# Patient Record
Sex: Male | Born: 1985 | Race: Black or African American | Hispanic: No | Marital: Single | Smoking: Never smoker
Health system: Southern US, Community
[De-identification: ages and names within clinical notes are randomized; demographics above are authoritative.]

## PROBLEM LIST (undated history)

## (undated) DIAGNOSIS — T7840XA Allergy, unspecified, initial encounter: Secondary | ICD-10-CM

## (undated) DIAGNOSIS — G809 Cerebral palsy, unspecified: Secondary | ICD-10-CM

## (undated) HISTORY — DX: Cerebral palsy, unspecified: G80.9

## (undated) HISTORY — DX: Allergy, unspecified, initial encounter: T78.40XA

## (undated) HISTORY — PX: TYMPANOSTOMY TUBE PLACEMENT: SHX32

## (undated) HISTORY — PX: ADENOIDECTOMY: SUR15

---

## 2005-03-04 ENCOUNTER — Emergency Department: Payer: Self-pay | Admitting: General Practice

## 2005-12-09 ENCOUNTER — Emergency Department: Payer: Self-pay | Admitting: Emergency Medicine

## 2007-11-05 ENCOUNTER — Encounter: Payer: Self-pay | Admitting: Family Medicine

## 2007-12-01 ENCOUNTER — Encounter: Payer: Self-pay | Admitting: Family Medicine

## 2007-12-31 ENCOUNTER — Encounter: Payer: Self-pay | Admitting: Family Medicine

## 2010-11-20 ENCOUNTER — Encounter: Payer: Self-pay | Admitting: Family Medicine

## 2010-12-01 ENCOUNTER — Encounter: Payer: Self-pay | Admitting: Family Medicine

## 2011-07-30 HISTORY — PX: DENTAL SURGERY: SHX609

## 2012-03-17 ENCOUNTER — Encounter: Payer: Self-pay | Admitting: Family Medicine

## 2012-04-01 ENCOUNTER — Encounter: Payer: Self-pay | Admitting: Family Medicine

## 2013-01-17 ENCOUNTER — Emergency Department: Payer: Self-pay | Admitting: Emergency Medicine

## 2013-06-15 ENCOUNTER — Encounter: Payer: Self-pay | Admitting: Family Medicine

## 2014-06-08 ENCOUNTER — Emergency Department: Payer: Self-pay | Admitting: Emergency Medicine

## 2014-10-05 LAB — LIPID PANEL
CHOLESTEROL: 194 mg/dL (ref 0–200)
HDL: 68 mg/dL (ref 35–70)
LDL Cholesterol: 110 mg/dL
TRIGLYCERIDES: 81 mg/dL (ref 40–160)

## 2015-03-16 DIAGNOSIS — E559 Vitamin D deficiency, unspecified: Secondary | ICD-10-CM | POA: Insufficient documentation

## 2015-03-16 DIAGNOSIS — J3089 Other allergic rhinitis: Secondary | ICD-10-CM | POA: Insufficient documentation

## 2015-03-16 DIAGNOSIS — F5102 Adjustment insomnia: Secondary | ICD-10-CM | POA: Insufficient documentation

## 2015-03-16 DIAGNOSIS — G809 Cerebral palsy, unspecified: Secondary | ICD-10-CM

## 2015-03-16 DIAGNOSIS — K5909 Other constipation: Secondary | ICD-10-CM | POA: Insufficient documentation

## 2015-03-16 DIAGNOSIS — G4709 Other insomnia: Secondary | ICD-10-CM | POA: Insufficient documentation

## 2015-03-16 DIAGNOSIS — J309 Allergic rhinitis, unspecified: Secondary | ICD-10-CM

## 2015-03-18 ENCOUNTER — Ambulatory Visit (INDEPENDENT_AMBULATORY_CARE_PROVIDER_SITE_OTHER): Payer: Medicaid Other | Admitting: Family Medicine

## 2015-03-18 ENCOUNTER — Encounter: Payer: Self-pay | Admitting: Family Medicine

## 2015-03-18 VITALS — BP 134/86 | HR 107 | Temp 98.2°F | Resp 22 | Ht 63.0 in | Wt 102.0 lb

## 2015-03-18 DIAGNOSIS — F5102 Adjustment insomnia: Secondary | ICD-10-CM | POA: Diagnosis not present

## 2015-03-18 DIAGNOSIS — K5900 Constipation, unspecified: Secondary | ICD-10-CM | POA: Diagnosis not present

## 2015-03-18 DIAGNOSIS — M62838 Other muscle spasm: Secondary | ICD-10-CM

## 2015-03-18 DIAGNOSIS — K4090 Unilateral inguinal hernia, without obstruction or gangrene, not specified as recurrent: Secondary | ICD-10-CM

## 2015-03-18 DIAGNOSIS — G809 Cerebral palsy, unspecified: Secondary | ICD-10-CM | POA: Diagnosis not present

## 2015-03-18 DIAGNOSIS — J309 Allergic rhinitis, unspecified: Secondary | ICD-10-CM

## 2015-03-18 DIAGNOSIS — Z993 Dependence on wheelchair: Secondary | ICD-10-CM | POA: Insufficient documentation

## 2015-03-18 DIAGNOSIS — J3089 Other allergic rhinitis: Secondary | ICD-10-CM

## 2015-03-18 DIAGNOSIS — Z23 Encounter for immunization: Secondary | ICD-10-CM

## 2015-03-18 DIAGNOSIS — K5909 Other constipation: Secondary | ICD-10-CM

## 2015-03-18 MED ORDER — LORATADINE 10 MG PO TABS: 10.0000 mg | ORAL_TABLET | Freq: Every day | ORAL | Status: DC

## 2015-03-18 NOTE — Progress Notes (Signed)
Name: Nicholas Bautista   MRN: [id number]    DOB: 1985-08-05   Date:03/18/2015       Progress Note  Subjective  Chief Complaint  Chief Complaint  Patient presents with  . Inguinal Hernia    mother concerned states felt/saw a knot while bathing.  Pt not complaining of any pain    HPI  Cerebral Palsy: he has severe spasm and is wheelchair dependent, mother states he likes being active and has seen the [employer]/l Excelerator and feels it would be beneficial for him. He is able to play with little bikes/turning the wheels.  He uses cyclobenzaprine for spasms  AR: mother states he does not allow anything in his nose, she has been using loratadine and prn Dayquil and seems to be working for him  Inguinal hernia right: mother noticed a bulge on his right inguinal hernia, no pain, redness, and is reducible.    Patient Active Problem List   Diagnosis Date Noted  . Muscle spasm 03/18/2015  . Wheelchair bound 03/18/2015  . Inguinal hernia, right 03/18/2015  . Cerebral palsy 03/16/2015  . Perennial allergic rhinitis 03/16/2015  . Chronic constipation 03/16/2015  . Insomnia, transient 03/16/2015  . Vitamin D deficiency 03/16/2015    Past Surgical History  Procedure Laterality Date  . Adenoidectomy    . Tympanostomy tube placement    . Dental surgery  07/30/2011    Family History  Problem Relation Age of Onset  . Obesity Mother   . Peptic Ulcer Father   . CAD Maternal Aunt   . Diabetes Maternal Uncle   . Diabetes Paternal Aunt   . Diabetes Maternal Grandmother   . CAD Paternal Grandmother     Social History   Social History  . Marital Status: Single    Spouse Name: N/A  . Number of Children: N/A  . Years of Education: N/A   Occupational History  . Not on file.   Social History Main Topics  . Smoking status: Never Smoker   . Smokeless tobacco: Not on file  . Alcohol Use: No  . Drug Use: No  . Sexual Activity: No   Other Topics Concern  . Not on file   Social  History Narrative     Current outpatient prescriptions:  .  cyclobenzaprine (FLEXERIL) 10 MG tablet, Take 1 tablet by mouth 3 (three) times daily., Disp: , Rfl:  .  diazepam (VALIUM) 5 MG tablet, Take 1 tablet by mouth as needed., Disp: , Rfl:  .  ibuprofen (ADVIL,MOTRIN) 600 MG tablet, Take 1 tablet by mouth as needed., Disp: , Rfl:  .  Olopatadine HCl (PATADAY) 0.2 % SOLN, Apply to eye., Disp: , Rfl:  .  polyethylene glycol powder (GLYCOLAX/MIRALAX) powder, Take by mouth., Disp: , Rfl:  .  temazepam (RESTORIL) 30 MG capsule, Take 1 capsule by mouth as needed., Disp: , Rfl:  .  loratadine (CLARITIN) 5 MG chewable tablet, Chew 1 tablet by mouth daily., Disp: , Rfl:  .  MULTIPLE VITAMIN PO, Take 1 tablet by mouth daily., Disp: , Rfl:   Allergies  Allergen Reactions  . Levofloxacin      ROS  Constitutional: Negative for fever or weight change.  Respiratory: Negative for cough and shortness of breath.   Cardiovascular: Negative for chest pain or palpitations.  Gastrointestinal: Negative for abdominal pain, no bowel changes.  Musculoskeletal: Positive for gait problem no  joint swelling.  Skin: Negative for rash.  Neurological: Negative for dizziness or headache.  No other specific complaints in a complete review of systems (except as listed in HPI above).  Objective  Filed Vitals:   03/18/15 1501  BP: 134/86  Pulse: 107  Temp: 98.2 F (36.8 C)  TempSrc: Oral  Resp: 22  Height:  (1.6 m)  Weight: 102 lb (46.267 kg)  SpO2: 98%    Body mass index is 18.07 kg/(m^2).  Physical Exam  Constitutional: Patient appears well-developed and well-nourished, thin, spastic,No distress.  HEENT: head atraumatic, normocephalic, pupils equal and reactive to light, ears normal  neck supple, throat within normal limits Cardiovascular: Normal rate, regular rhythm and normal heart sounds.  No murmur heard. No BLE edema. Pulmonary/Chest: Effort normal and breath sounds normal. No  respiratory distress. Abdominal: Soft.  There is no tenderness. Psychiatric: Patient has a normal mood and affect. behavior is normal. Judgment and thought content normal. Muscular Skeletal: he is unable to bear weight , atrophic lower extremities, spasm of upper and lower extremities during visit    PHQ2/9: Depression screen [facility] 2/9 03/18/2015  Decreased Interest 0  Down, Depressed, Hopeless 0  PHQ - 2 Score 0     Fall Risk: Fall Risk  03/18/2015  Falls in the past year? No      Functional Status Survey: Is the patient deaf or have difficulty hearing?: No Does the patient have difficulty seeing, even when wearing glasses/contacts?: No Does the patient have difficulty concentrating, remembering, or making decisions?: No Does the patient have difficulty walking or climbing stairs?: Yes Does the patient have difficulty dressing or bathing?: Yes Does the patient have difficulty doing errands alone such as visiting a doctor's office or shopping?: Yes   Assessment & Plan  1. Cerebral palsy Prescription for To End Lil Excelerator   2. Needs flu shot  - Flu Vaccine QUAD 36+ mos PF IM (Fluarix & Fluzone Quad PF)  3. Chronic constipation Doing better, drinking more water  4. Insomnia, transient Doing well with prn medication  5. Muscle spasm Continue medication  6. Wheelchair bound   7. Inguinal hernia, right Discussed referral to surgeon now or watchful waiting, and mother chose the later. Discussed signs and symptoms of incarceration and the need for immediate attention

## 2015-03-21 ENCOUNTER — Ambulatory Visit: Payer: Self-pay | Admitting: Family Medicine

## 2015-05-24 ENCOUNTER — Telehealth: Payer: Self-pay | Admitting: Family Medicine

## 2015-05-24 NOTE — Telephone Encounter (Signed)
PT MOTHER NEEDS A LETTER SAYING THAT HE IS TOTAL DISABLED  AND THAT HIS ADDRESS IS THE ONE ON FILE AS OF 2015.

## 2015-05-26 NOTE — Telephone Encounter (Signed)
Please write the letter

## 2015-05-30 ENCOUNTER — Telehealth: Payer: Self-pay | Admitting: Family Medicine

## 2015-05-30 NOTE — Telephone Encounter (Signed)
MOTHER WANTING TO KNOW WHEN THE LETTER WILL BE READY THAT WAS REQUESTED ON NOV 22. SEE NOTE BEFORE THIS ONE. SAYS IT HAS TO BE ON LETTER HEAD SAYING THAT HE IS TOTAL DISABLED SINCE BIRTH. IT IS HAVING TO GO TO THE IRS FOR VERIFICATION.

## 2015-05-31 NOTE — Telephone Encounter (Signed)
Left voicemail for letter for pickup

## 2015-05-31 NOTE — Telephone Encounter (Signed)
Please write the letter. It is probably on Tiffany's inbox. I had said okay for it before

## 2015-06-10 ENCOUNTER — Other Ambulatory Visit: Payer: Self-pay | Admitting: Family Medicine

## 2015-06-10 NOTE — Telephone Encounter (Signed)
Patient requesting refill. 

## 2015-06-13 ENCOUNTER — Other Ambulatory Visit: Payer: Self-pay | Admitting: Family Medicine

## 2015-07-20 ENCOUNTER — Other Ambulatory Visit: Payer: Self-pay | Admitting: Family Medicine

## 2015-07-20 NOTE — Telephone Encounter (Signed)
Patient requesting refill. 

## 2015-08-31 ENCOUNTER — Ambulatory Visit (INDEPENDENT_AMBULATORY_CARE_PROVIDER_SITE_OTHER): Payer: Medicaid Other | Admitting: Family Medicine

## 2015-08-31 ENCOUNTER — Encounter: Payer: Self-pay | Admitting: Family Medicine

## 2015-08-31 VITALS — BP 108/78 | HR 110 | Temp 97.9°F | Resp 20 | Wt 102.6 lb

## 2015-08-31 DIAGNOSIS — K5900 Constipation, unspecified: Secondary | ICD-10-CM

## 2015-08-31 DIAGNOSIS — F5102 Adjustment insomnia: Secondary | ICD-10-CM | POA: Diagnosis not present

## 2015-08-31 DIAGNOSIS — M62838 Other muscle spasm: Secondary | ICD-10-CM | POA: Diagnosis not present

## 2015-08-31 DIAGNOSIS — G802 Spastic hemiplegic cerebral palsy: Secondary | ICD-10-CM | POA: Diagnosis not present

## 2015-08-31 DIAGNOSIS — Z993 Dependence on wheelchair: Secondary | ICD-10-CM

## 2015-08-31 DIAGNOSIS — K5909 Other constipation: Secondary | ICD-10-CM

## 2015-08-31 DIAGNOSIS — J309 Allergic rhinitis, unspecified: Secondary | ICD-10-CM | POA: Diagnosis not present

## 2015-08-31 DIAGNOSIS — J3089 Other allergic rhinitis: Secondary | ICD-10-CM

## 2015-08-31 MED ORDER — DIAZEPAM 5 MG PO TABS: 2.5000 mg | ORAL_TABLET | Freq: Every day | ORAL | Status: DC | PRN

## 2015-08-31 MED ORDER — CYCLOBENZAPRINE HCL 10 MG PO TABS: 10.0000 mg | ORAL_TABLET | Freq: Two times a day (BID) | ORAL | Status: DC

## 2015-08-31 MED ORDER — TEMAZEPAM 30 MG PO CAPS: 30.0000 mg | ORAL_CAPSULE | ORAL | Status: DC | PRN

## 2015-08-31 MED ORDER — OLOPATADINE HCL 0.2 % OP SOLN: 1.0000 [drp] | Freq: Every day | OPHTHALMIC | Status: DC

## 2015-08-31 NOTE — Progress Notes (Signed)
Name: Nicholas Bautista   MRN: [id number]    DOB: March 07, 1986   Date:08/31/2015       Progress Note  Subjective  Chief Complaint  Chief Complaint  Patient presents with  . Advice Only    patient needs forms completed    HPI  Cerebral Palsy: he has severe spasm and is wheelchair dependent. He is able to play with little bikes/turning the wheels. He uses cyclobenzaprine for spasms, he is unable to make decision on his own. He has 24 hour care at home, from mother and sister, he goes to a day program 4 days a week. He enjoys that. Mother can understand him, but is pretty much non-verbal for a person who does not live with him. He takes Valium prn for severe spasms or anxiety  AR: mother states he does not allow anything in his nose, she has been using loratadine and prn Dayquil and seems to be working for him, however recently he has noticed itchy and watery eyes. He needs to resume Pataday.  Chronic Constipation: he takes Miralax every other day to be able to have regular bowel movements. No straining and no blood in stools.   Insomnia: takes Temazepam prn for sleep. No side effects  Patient Active Problem List   Diagnosis Date Noted  . Muscle spasm 03/18/2015  . Wheelchair bound 03/18/2015  . Inguinal hernia, right 03/18/2015  . Cerebral palsy (HCC) 03/16/2015  . Perennial allergic rhinitis 03/16/2015  . Chronic constipation 03/16/2015  . Insomnia, transient 03/16/2015  . Vitamin D deficiency 03/16/2015    Past Surgical History  Procedure Laterality Date  . Adenoidectomy    . Tympanostomy tube placement    . Dental surgery  07/30/2011    Family History  Problem Relation Age of Onset  . Obesity Mother   . Peptic Ulcer Father   . CAD Maternal Aunt   . Diabetes Maternal Uncle   . Diabetes Paternal Aunt   . Diabetes Maternal Grandmother   . CAD Paternal Grandmother     Social History   Social History  . Marital Status: Single    Spouse Name: N/A  . Number of  Children: N/A  . Years of Education: N/A   Occupational History  . Not on file.   Social History Main Topics  . Smoking status: Never Smoker   . Smokeless tobacco: Not on file  . Alcohol Use: No  . Drug Use: No  . Sexual Activity: No   Other Topics Concern  . Not on file   Social History Narrative     Current outpatient prescriptions:  .  cyclobenzaprine (FLEXERIL) 10 MG tablet, Take 1 tablet (10 mg total) by mouth 2 (two) times daily., Disp: 60 tablet, Rfl: 2 .  diazepam (VALIUM) 5 MG tablet, Take 0.5-1 tablets (2.5-5 mg total) by mouth daily as needed for anxiety., Disp: 30 tablet, Rfl: 0 .  ibuprofen (ADVIL,MOTRIN) 600 MG tablet, TAKE ONE TABLET BY MOUTH THREE TIMES DAILY AS NEEDED, Disp: 60 tablet, Rfl: 0 .  loratadine (CLARITIN) 10 MG tablet, Take 1 tablet (10 mg total) by mouth daily., Disp: 30 tablet, Rfl: 11 .  MULTIPLE VITAMIN PO, Take 1 tablet by mouth daily., Disp: , Rfl:  .  Olopatadine HCl (PATADAY) 0.2 % SOLN, Apply 1 drop to eye daily., Disp: 2.5 mL, Rfl: 2 .  polyethylene glycol powder (GLYCOLAX/MIRALAX) powder, Take by mouth., Disp: , Rfl:  .  temazepam (RESTORIL) 30 MG capsule, Take 1 capsule (30 mg  total) by mouth as needed., Disp: 30 capsule, Rfl: 2  Allergies  Allergen Reactions  . Levofloxacin      ROS  Constitutional: Negative for fever or weight change.  Respiratory: Negative for cough and shortness of breath.   Cardiovascular: Negative for chest pain or palpitations.  Gastrointestinal: Negative for abdominal pain, no bowel changes.  Musculoskeletal: Negative for gait problem or joint swelling.  Skin: Negative for rash.  Neurological: Negative for dizziness or headache.  No other specific complaints in a complete review of systems (except as listed in HPI above).  Objective  Filed Vitals:   08/31/15 0957  BP: 108/78  Pulse: 110  Temp: 97.9 F (36.6 C)  TempSrc: Oral  Resp: 20  Weight: 102 lb 9.6 oz (46.539 kg)  SpO2: 97%    Body  mass index is 18.18 kg/(m^2).  Physical Exam  Constitutional: Patient appears well-developed and thin. Increase muscle tone, invonluntary movements secondary to spasms. No distress.  HEENT: head atraumatic, normocephalic, pupils equal and reactive to light, , neck supple, throat within normal limits Cardiovascular: Normal rate, regular rhythm and normal heart sounds.  No murmur heard. No BLE edema. Pulmonary/Chest: Effort normal and breath sounds normal. No respiratory distress. Abdominal: Soft.  There is no tenderness. Psychiatric: Patient has a normal mood and affect. behavior is normal.   PHQ2/9: Depression screen [facility] 2/9 08/31/2015 03/18/2015  Decreased Interest 0 0  Down, Depressed, Hopeless 0 0  PHQ - 2 Score 0 0    Fall Risk: Fall Risk  08/31/2015 03/18/2015  Falls in the past year? No No     Functional Status Survey: Is the patient deaf or have difficulty hearing?: No Does the patient have difficulty seeing, even when wearing glasses/contacts?: No Does the patient have difficulty concentrating, remembering, or making decisions?: Yes Does the patient have difficulty walking or climbing stairs?: Yes Does the patient have difficulty dressing or bathing?: Yes Does the patient have difficulty doing errands alone such as visiting a doctor's office or shopping?: Yes    Assessment & Plan  1. Spastic hemiplegic cerebral palsy (HCC)  Forms filled out for [facility] and also needs a letter of incompetency done. FL2 form filled out - diazepam (VALIUM) 5 MG tablet; Take 0.5-1 tablets (2.5-5 mg total) by mouth daily as needed for anxiety.  Dispense: 30 tablet; Refill: 0 - cyclobenzaprine (FLEXERIL) 10 MG tablet; Take 1 tablet (10 mg total) by mouth 2 (two) times daily.  Dispense: 60 tablet; Refill: 2  2. Wheelchair bound   3. Insomnia, transient  - temazepam (RESTORIL) 30 MG capsule; Take 1 capsule (30 mg total) by mouth as needed.  Dispense: 30 capsule; Refill: 2  4. Chronic  constipation  Continue medication  5. Muscle spasm  - diazepam (VALIUM) 5 MG tablet; Take 0.5-1 tablets (2.5-5 mg total) by mouth daily as needed for anxiety.  Dispense: 30 tablet; Refill: 0 - cyclobenzaprine (FLEXERIL) 10 MG tablet; Take 1 tablet (10 mg total) by mouth 2 (two) times daily.  Dispense: 60 tablet; Refill: 2  6. Perennial allergic rhinitis  - Olopatadine HCl (PATADAY) 0.2 % SOLN; Apply 1 drop to eye daily.  Dispense: 2.5 mL; Refill: 2

## 2015-09-05 ENCOUNTER — Ambulatory Visit: Payer: Medicaid Other | Admitting: Family Medicine

## 2015-12-05 ENCOUNTER — Encounter: Payer: Self-pay | Admitting: Family Medicine

## 2015-12-05 ENCOUNTER — Ambulatory Visit (INDEPENDENT_AMBULATORY_CARE_PROVIDER_SITE_OTHER): Payer: Medicaid Other | Admitting: Family Medicine

## 2015-12-05 VITALS — BP 112/74 | HR 109 | Temp 98.0°F | Resp 16

## 2015-12-05 DIAGNOSIS — L03818 Cellulitis of other sites: Secondary | ICD-10-CM

## 2015-12-05 MED ORDER — CEPHALEXIN 500 MG PO CAPS: 500.0000 mg | ORAL_CAPSULE | Freq: Three times a day (TID) | ORAL | Status: DC

## 2015-12-05 NOTE — Progress Notes (Signed)
Name: Nicholas Bautista   MRN: [id number]    DOB: Apr 15, 1986   Date:12/05/2015       Progress Note  Subjective  Chief Complaint  Chief Complaint  Patient presents with  . Wrist Pain    patient's mom stated that he was complaining of right wrist pain for about about a week. the area was swollen and very painful. she took him to the urgent care and was told it was cellulitis. she has be given him ibuprofen and flexeril.     HPI  Cellulitis of right arm: mother took him to Urgent care on 05/31 because his right arm was red, swollen from arm to right wrist and hand. He was given Keflex to take twice daily and redness, swelling and pain has improved significantly, mild swelling only now. He is feeling well.    Patient Active Problem List   Diagnosis Date Noted  . Muscle spasm 03/18/2015  . Wheelchair bound 03/18/2015  . Inguinal hernia, right 03/18/2015  . Cerebral palsy (HCC) 03/16/2015  . Perennial allergic rhinitis 03/16/2015  . Chronic constipation 03/16/2015  . Insomnia, transient 03/16/2015  . Vitamin D deficiency 03/16/2015    Past Surgical History  Procedure Laterality Date  . Adenoidectomy    . Tympanostomy tube placement    . Dental surgery  07/30/2011    Family History  Problem Relation Age of Onset  . Obesity Mother   . Peptic Ulcer Father   . CAD Maternal Aunt   . Diabetes Maternal Uncle   . Diabetes Paternal Aunt   . Diabetes Maternal Grandmother   . CAD Paternal Grandmother     Social History   Social History  . Marital Status: Single    Spouse Name: N/A  . Number of Children: N/A  . Years of Education: N/A   Occupational History  . Not on file.   Social History Main Topics  . Smoking status: Never Smoker   . Smokeless tobacco: Not on file  . Alcohol Use: No  . Drug Use: No  . Sexual Activity: No   Other Topics Concern  . Not on file   Social History Narrative     Current outpatient prescriptions:  .  cephALEXin (KEFLEX) 500 MG  capsule, Take 1 capsule (500 mg total) by mouth 3 (three) times daily., Disp: 30 capsule, Rfl: 0 .  cyclobenzaprine (FLEXERIL) 10 MG tablet, Take 1 tablet (10 mg total) by mouth 2 (two) times daily., Disp: 60 tablet, Rfl: 2 .  diazepam (VALIUM) 5 MG tablet, Take 0.5-1 tablets (2.5-5 mg total) by mouth daily as needed for anxiety., Disp: 30 tablet, Rfl: 0 .  ibuprofen (ADVIL,MOTRIN) 600 MG tablet, TAKE ONE TABLET BY MOUTH THREE TIMES DAILY AS NEEDED, Disp: 60 tablet, Rfl: 0 .  loratadine (CLARITIN) 10 MG tablet, Take 1 tablet (10 mg total) by mouth daily., Disp: 30 tablet, Rfl: 11 .  MULTIPLE VITAMIN PO, Take 1 tablet by mouth daily., Disp: , Rfl:  .  Olopatadine HCl (PATADAY) 0.2 % SOLN, Apply 1 drop to eye daily., Disp: 2.5 mL, Rfl: 2 .  polyethylene glycol powder (GLYCOLAX/MIRALAX) powder, Take by mouth., Disp: , Rfl:  .  temazepam (RESTORIL) 30 MG capsule, Take 1 capsule (30 mg total) by mouth as needed., Disp: 30 capsule, Rfl: 2  Allergies  Allergen Reactions  . Levofloxacin      ROS  Ten systems reviewed and is negative except as mentioned in HPI   Objective  Filed Vitals:  12/05/15 1623  BP: 112/74  Pulse: 109  Temp: 98 F (36.7 C)  TempSrc: Oral  Resp: 16  SpO2: 94%    There is no weight on file to calculate BMI.  Physical Exam  Constitutional: Patient appears well-developed and thin. Increase muscle tone, invonluntary movements secondary to spasms. No distress.  HEENT: head atraumatic, normocephalic , neck supple, throat within normal limits Cardiovascular: Normal rate, regular rhythm and normal heart sounds. No murmur heard. No BLE edema. Pulmonary/Chest: Effort normal and breath sounds normal. No respiratory distress. Abdominal: Soft. There is no tenderness. Psychiatric: cooperative seems happy  Skin: non -tender, mild area of swelling on radial aspect of arm, no redness or increase in warmth, full rom of wrist  PHQ2/9: [facility] 12/05/2015  08/31/2015 03/18/2015  Decreased Interest 0 0 0  Down, Depressed, Hopeless 0 0 0  PHQ - 2 Score 0 0 0     Fall Risk: Fall Risk  12/05/2015 08/31/2015 03/18/2015  Falls in the past year? No No No     Functional Status Survey: Is the patient deaf or have difficulty hearing?: No Does the patient have difficulty seeing, even when wearing glasses/contacts?: No Does the patient have difficulty concentrating, remembering, or making decisions?: No Does the patient have difficulty walking or climbing stairs?: Yes Does the patient have difficulty dressing or bathing?: Yes Does the patient have difficulty doing errands alone such as visiting a doctor's office or shopping?: Yes   Assessment & Plan  1. Cellulitis of other specified site  Advised to take it three times daily, may use warm compresses now to decrease swelling  - cephALEXin (KEFLEX) 500 MG capsule; Take 1 capsule (500 mg total) by mouth 3 (three) times daily.  Dispense: 30 capsule; Refill: 0

## 2015-12-23 ENCOUNTER — Other Ambulatory Visit: Payer: Self-pay | Admitting: Family Medicine

## 2015-12-23 NOTE — Telephone Encounter (Signed)
Patient requesting refill. 

## 2016-01-04 ENCOUNTER — Ambulatory Visit: Payer: Medicaid Other | Admitting: Family Medicine

## 2016-03-26 ENCOUNTER — Ambulatory Visit (INDEPENDENT_AMBULATORY_CARE_PROVIDER_SITE_OTHER): Payer: Medicaid Other | Admitting: Family Medicine

## 2016-03-26 ENCOUNTER — Encounter: Payer: Self-pay | Admitting: Family Medicine

## 2016-03-26 VITALS — BP 122/68 | HR 78 | Temp 98.7°F | Resp 18 | Ht 63.0 in | Wt 103.0 lb

## 2016-03-26 DIAGNOSIS — R8290 Unspecified abnormal findings in urine: Secondary | ICD-10-CM | POA: Diagnosis not present

## 2016-03-26 DIAGNOSIS — R3911 Hesitancy of micturition: Secondary | ICD-10-CM

## 2016-03-26 DIAGNOSIS — R350 Frequency of micturition: Secondary | ICD-10-CM

## 2016-03-26 DIAGNOSIS — Z23 Encounter for immunization: Secondary | ICD-10-CM

## 2016-03-26 LAB — POCT URINALYSIS DIPSTICK
GLUCOSE UA: NEGATIVE
Nitrite, UA: NEGATIVE
Protein, UA: NEGATIVE
RBC UA: NEGATIVE
SPEC GRAV UA: 1.01
UROBILINOGEN UA: 1
pH, UA: 8.5

## 2016-03-26 MED ORDER — NITROFURANTOIN MONOHYD MACRO 100 MG PO CAPS: 100.0000 mg | ORAL_CAPSULE | Freq: Two times a day (BID) | ORAL | 0 refills | Status: DC

## 2016-03-26 NOTE — Progress Notes (Signed)
Name: Nicholas Bautista   MRN: [id number]    DOB: May 03, 1986   Date:03/26/2016       Progress Note  Subjective  Chief Complaint  Chief Complaint  Patient presents with  . Urinary Tract Infection    unable to urinate, dark color and odor for 4 days    HPI  Urinary problems: mother states that for the past 4 days he has hesitancy, urinary frequency, bad urine odor, no fever, change in behavior or appetite. He is wheelchair bound from cerebral palsy. He has chronic constipation, and it took him longer to have a bowel movement yesterday, but stable otherwise.    Patient Active Problem List   Diagnosis Date Noted  . Muscle spasm 03/18/2015  . Wheelchair bound 03/18/2015  . Inguinal hernia, right 03/18/2015  . Cerebral palsy (HCC) 03/16/2015  . Perennial allergic rhinitis 03/16/2015  . Chronic constipation 03/16/2015  . Insomnia, transient 03/16/2015  . Vitamin D deficiency 03/16/2015    Past Surgical History:  Procedure Laterality Date  . ADENOIDECTOMY    . DENTAL SURGERY  07/30/2011  . TYMPANOSTOMY TUBE PLACEMENT      Family History  Problem Relation Age of Onset  . Obesity Mother   . Peptic Ulcer Father   . CAD Maternal Aunt   . Diabetes Maternal Uncle   . Diabetes Paternal Aunt   . Diabetes Maternal Grandmother   . CAD Paternal Grandmother     Social History   Social History  . Marital status: Single    Spouse name: N/A  . Number of children: N/A  . Years of education: N/A   Occupational History  . Not on file.   Social History Main Topics  . Smoking status: Never Smoker  . Smokeless tobacco: Not on file  . Alcohol use No  . Drug use: No  . Sexual activity: No   Other Topics Concern  . Not on file   Social History Narrative  . No narrative on file     Current Outpatient Prescriptions:  .  cyclobenzaprine (FLEXERIL) 10 MG tablet, Take 1 tablet (10 mg total) by mouth 2 (two) times daily., Disp: 60 tablet, Rfl: 2 .  diazepam (VALIUM) 5 MG tablet,  Take 0.5-1 tablets (2.5-5 mg total) by mouth daily as needed for anxiety., Disp: 30 tablet, Rfl: 0 .  ibuprofen (ADVIL,MOTRIN) 600 MG tablet, TAKE ONE TABLET BY MOUTH THREE TIMES DAILY AS NEEDED, Disp: 60 tablet, Rfl: 0 .  loratadine (CLARITIN) 10 MG tablet, Take 1 tablet (10 mg total) by mouth daily., Disp: 30 tablet, Rfl: 11 .  MULTIPLE VITAMIN PO, Take 1 tablet by mouth daily., Disp: , Rfl:  .  Olopatadine HCl (PATADAY) 0.2 % SOLN, Apply 1 drop to eye daily., Disp: 2.5 mL, Rfl: 2 .  polyethylene glycol powder (GLYCOLAX/MIRALAX) powder, MIX ONE CAPFUL (17 G) IN 8 OUNCES OF FLUIDS AND DRINK ONCE DAILY, Disp: 527 g, Rfl: 5 .  temazepam (RESTORIL) 30 MG capsule, TAKE ONE CAPSULE BY MOUTH AT BEDTIME AS NEEDED FOR SLEEP, Disp: 30 capsule, Rfl: 0  Allergies  Allergen Reactions  . Levofloxacin      ROS  Ten systems reviewed and is negative except as mentioned in HPI   Objective  Vitals:   03/26/16 1548  BP: 122/68  Pulse: 78  Resp: 18  Temp: 98.7 F (37.1 C)  SpO2: 98%  Weight: 103 lb (46.7 kg)  Height: 5\' 3"  (1.6 m)    Body mass index is 18.25 kg/m.  Physical Exam  Constitutional: Patient appears well-developed and thin. Increase muscle tone, invonluntary movements secondary to spasms. No distress.  HEENT: head atraumatic, normocephalic , neck supple, throat within normal limits Cardiovascular: Normal rate, regular rhythm and normal heart sounds. No murmur heard. No BLE edema. Pulmonary/Chest: Effort normal and breath sounds normal. No respiratory distress. Abdominal: Soft. There is no tenderness. Psychiatric: cooperative seems happy    PHQ2/9: Depression screen [facility] 2/9 03/26/2016 12/05/2015 08/31/2015 03/18/2015  Decreased Interest 0 0 0 0  Down, Depressed, Hopeless 0 0 0 0  PHQ - 2 Score 0 0 0 0     Fall Risk: Fall Risk  03/26/2016 12/05/2015 08/31/2015 03/18/2015  Falls in the past year? No No No No     Functional Status Survey: Is the patient deaf or have difficulty  hearing?: No Does the patient have difficulty seeing, even when wearing glasses/contacts?: No Does the patient have difficulty concentrating, remembering, or making decisions?: Yes Does the patient have difficulty walking or climbing stairs?: Yes Does the patient have difficulty dressing or bathing?: Yes Does the patient have difficulty doing errands alone such as visiting a doctor's office or shopping?: Yes    Assessment & Plan   1. Bad odor of urine  - CULTURE, URINE COMPREHENSIVE - POCT Urinalysis Dipstick - nitrofurantoin, macrocrystal-monohydrate, (MACROBID) 100 MG capsule; Take 1 capsule (100 mg total) by mouth 2 (two) times daily.  Dispense: 14 capsule; Refill: 0   2. Hesitancy  - CULTURE, URINE COMPREHENSIVE - POCT Urinalysis Dipstick  3. Urinary frequency  - CULTURE, URINE COMPREHENSIVE - POCT Urinalysis Dipstick  4. Needs flu shot  - Flu Vaccine QUAD 36+ mos IM

## 2016-03-29 LAB — CULTURE, URINE COMPREHENSIVE: Organism ID, Bacteria: NO GROWTH

## 2016-06-14 ENCOUNTER — Other Ambulatory Visit: Payer: Self-pay | Admitting: Family Medicine

## 2016-09-19 ENCOUNTER — Other Ambulatory Visit: Payer: Self-pay | Admitting: Family Medicine

## 2016-09-19 DIAGNOSIS — M62838 Other muscle spasm: Secondary | ICD-10-CM

## 2016-09-19 DIAGNOSIS — G802 Spastic hemiplegic cerebral palsy: Secondary | ICD-10-CM

## 2016-09-19 NOTE — Telephone Encounter (Signed)
Patient requesting refill of Flexeril and Patanol to [facility].

## 2016-10-17 ENCOUNTER — Ambulatory Visit (INDEPENDENT_AMBULATORY_CARE_PROVIDER_SITE_OTHER): Payer: Medicaid Other | Admitting: Family Medicine

## 2016-10-17 ENCOUNTER — Encounter: Payer: Self-pay | Admitting: Family Medicine

## 2016-10-17 VITALS — BP 118/68 | HR 86 | Temp 97.4°F | Resp 16

## 2016-10-17 DIAGNOSIS — M62838 Other muscle spasm: Secondary | ICD-10-CM | POA: Diagnosis not present

## 2016-10-17 DIAGNOSIS — J309 Allergic rhinitis, unspecified: Secondary | ICD-10-CM

## 2016-10-17 DIAGNOSIS — G802 Spastic hemiplegic cerebral palsy: Secondary | ICD-10-CM

## 2016-10-17 DIAGNOSIS — K5909 Other constipation: Secondary | ICD-10-CM

## 2016-10-17 DIAGNOSIS — F5102 Adjustment insomnia: Secondary | ICD-10-CM

## 2016-10-17 DIAGNOSIS — H1013 Acute atopic conjunctivitis, bilateral: Secondary | ICD-10-CM | POA: Diagnosis not present

## 2016-10-17 MED ORDER — CYCLOBENZAPRINE HCL 10 MG PO TABS: 10.0000 mg | ORAL_TABLET | Freq: Three times a day (TID) | ORAL | 5 refills | Status: DC

## 2016-10-17 MED ORDER — IBUPROFEN 600 MG PO TABS: 600.0000 mg | ORAL_TABLET | Freq: Three times a day (TID) | ORAL | 0 refills | Status: DC | PRN

## 2016-10-17 MED ORDER — TEMAZEPAM 30 MG PO CAPS: 30.0000 mg | ORAL_CAPSULE | Freq: Every evening | ORAL | 5 refills | Status: DC | PRN

## 2016-10-17 MED ORDER — DIAZEPAM 5 MG PO TABS: 2.5000 mg | ORAL_TABLET | Freq: Every day | ORAL | 0 refills | Status: DC | PRN

## 2016-10-17 MED ORDER — OLOPATADINE HCL 0.1 % OP SOLN: 1.0000 [drp] | Freq: Two times a day (BID) | OPHTHALMIC | 5 refills | Status: DC

## 2016-10-17 NOTE — Progress Notes (Signed)
Name: Nicholas Bautista   MRN: [id number]    DOB: [birth year]   Date:10/17/2016       Progress Note  Subjective  Chief Complaint  Chief Complaint  Patient presents with  . Medication Refill    needs paperwork for wheelchair filled out also    HPI  Cerebral Palsy: he has severe spasm and is wheelchair dependent. He is able to play with little bikes/turning the wheels. He uses cyclobenzaprine for spasms but recently mother states spasms worse during day program, we will increase frequency to three times daily. He is unable to make decisions on his own. He has 24 hour care at home, from mother and sister, he goes to a day program 4 days a week. Mother can understand him, but is pretty much non-verbal for a person who does not live with him. He takes Valium prn for severe spasms or anxiety. Sometimes he had pains that is diffent than regular spasms and takes Motrin prn  AR: mother states he does not allow anything in his nose, she has been using loratadine and prn Dayquil and seems to be working for him, he has itchy and watery eyes that is worse this time of the year, needs refill of Patanol   Chronic Constipation: he takes Miralax every other day to be able to have regular bowel movements. No straining and no blood in stools.   Insomnia: takes Temazepam prn for sleep. No side effects, except for giggles during the night  Patient Active Problem List   Diagnosis Date Noted  . Muscle spasm 03/18/2015  . Wheelchair bound 03/18/2015  . Inguinal hernia, right 03/18/2015  . Cerebral palsy (HCC) 03/16/2015  . Perennial allergic rhinitis 03/16/2015  . Chronic constipation 03/16/2015  . Insomnia, transient 03/16/2015  . Vitamin D deficiency 03/16/2015    Past Surgical History:  Procedure Laterality Date  . ADENOIDECTOMY    . DENTAL SURGERY  07/30/2011  . TYMPANOSTOMY TUBE PLACEMENT      Family History  Problem Relation Age of Onset  . Obesity Mother   . Peptic Ulcer Father   .  Diabetes Paternal Aunt   . CAD Maternal Aunt   . Diabetes Maternal Uncle   . Diabetes Maternal Grandmother   . CAD Paternal Grandmother     Social History   Social History  . Marital status: Single    Spouse name: N/A  . Number of children: N/A  . Years of education: N/A   Occupational History  . disabled    Social History Main Topics  . Smoking status: Never Smoker  . Smokeless tobacco: Never Used  . Alcohol use No  . Drug use: No  . Sexual activity: No   Other Topics Concern  . Not on file   Social History Narrative   Lives with mother, sister and niece     Current Outpatient Prescriptions:  .  cyclobenzaprine (FLEXERIL) 10 MG tablet, Take 1 tablet (10 mg total) by mouth 3 (three) times daily., Disp: 90 tablet, Rfl: 5 .  diazepam (VALIUM) 5 MG tablet, Take 0.5-1 tablets (2.5-5 mg total) by mouth daily as needed for anxiety., Disp: 30 tablet, Rfl: 0 .  ibuprofen (ADVIL,MOTRIN) 600 MG tablet, Take 1 tablet (600 mg total) by mouth 3 (three) times daily as needed., Disp: 60 tablet, Rfl: 0 .  loratadine (CLARITIN) 10 MG tablet, Take 1 tablet (10 mg total) by mouth daily., Disp: 30 tablet, Rfl: 11 .  MULTIPLE VITAMIN PO, Take 1 tablet by  mouth daily., Disp: , Rfl:  .  olopatadine (PATANOL) 0.1 % ophthalmic solution, Place 1 drop into both eyes 2 (two) times daily., Disp: 5 mL, Rfl: 5 .  polyethylene glycol powder (GLYCOLAX/MIRALAX) powder, MIX ONE CAPFUL (17 G) IN 8 OUNCES OF FLUIDS AND DRINK ONCE DAILY, Disp: 527 g, Rfl: 5 .  temazepam (RESTORIL) 30 MG capsule, Take 1 capsule (30 mg total) by mouth at bedtime as needed. for sleep, Disp: 30 capsule, Rfl: 5  Allergies  Allergen Reactions  . Levofloxacin      ROS  Constitutional: Negative for fever or weight change.  Respiratory: Negative for cough and shortness of breath.   Cardiovascular: Negative for chest pain or palpitations.  Gastrointestinal: Negative for abdominal pain, no bowel changes.   Musculoskeletal:Positive for gait problem but no joint swelling.  Skin: Negative for rash.  Neurological: Negative for dizziness or headache.  No other specific complaints in a complete review of systems (except as listed in HPI    Objective  Vitals:   10/17/16 1100  BP: 118/68  Pulse: 86  Resp: 16  Temp: 97.4 F (36.3 C)  SpO2: 93%    There is no height or weight on file to calculate BMI.  Physical Exam  Constitutional: Patient appears well-developed and thin. Increase muscle tone, invonluntary movements secondary to spasms. No distress.  HEENT: head atraumatic, normocephalic, pupils equal and reactive to light, , neck supple, throat within normal limits Cardiovascular: Normal rate, regular rhythm and normal heart sounds.  No murmur heard. No BLE edema. Pulmonary/Chest: Effort normal and breath sounds normal. No respiratory distress. Abdominal: Soft.  There is no tenderness. Psychiatric: Seems happy, cooperative Muscular Skeletal: increase in tonus, spastic, sitting on wheelchair  PHQ2/9: Depression screen [facility] 2/9 03/26/2016 12/05/2015 08/31/2015 03/18/2015  Decreased Interest 0 0 0 0  Down, Depressed, Hopeless 0 0 0 0  PHQ - 2 Score 0 0 0 0     Fall Risk: Fall Risk  03/26/2016 12/05/2015 08/31/2015 03/18/2015  Falls in the past year? No No No No      Assessment & Plan  1. Spastic hemiplegic cerebral palsy (HCC)  - cyclobenzaprine (FLEXERIL) 10 MG tablet; Take 1 tablet (10 mg total) by mouth 3 (three) times daily.  Dispense: 90 tablet; Refill: 5 - diazepam (VALIUM) 5 MG tablet; Take 0.5-1 tablets (2.5-5 mg total) by mouth daily as needed for anxiety.  Dispense: 30 tablet; Refill: 0  2. Insomnia, transient  - temazepam (RESTORIL) 30 MG capsule; Take 1 capsule (30 mg total) by mouth at bedtime as needed. for sleep  Dispense: 30 capsule; Refill: 5  3. Chronic constipation  Continue Miralax  4. Muscle spasm  - cyclobenzaprine (FLEXERIL) 10 MG tablet; Take 1 tablet (10  mg total) by mouth 3 (three) times daily.  Dispense: 90 tablet; Refill: 5 - ibuprofen (ADVIL,MOTRIN) 600 MG tablet; Take 1 tablet (600 mg total) by mouth 3 (three) times daily as needed.  Dispense: 60 tablet; Refill: 0 - diazepam (VALIUM) 5 MG tablet; Take 0.5-1 tablets (2.5-5 mg total) by mouth daily as needed for anxiety.  Dispense: 30 tablet; Refill: 0  5. Allergic conjunctivitis of both eyes and rhinitis  - olopatadine (PATANOL) 0.1 % ophthalmic solution; Place 1 drop into both eyes 2 (two) times daily.  Dispense: 5 mL; Refill: 5

## 2016-12-18 ENCOUNTER — Encounter: Payer: Self-pay | Admitting: Family Medicine

## 2016-12-18 ENCOUNTER — Ambulatory Visit (INDEPENDENT_AMBULATORY_CARE_PROVIDER_SITE_OTHER): Payer: Medicaid Other | Admitting: Family Medicine

## 2016-12-18 VITALS — BP 124/68 | HR 95 | Temp 98.0°F | Resp 16

## 2016-12-18 DIAGNOSIS — K4020 Bilateral inguinal hernia, without obstruction or gangrene, not specified as recurrent: Secondary | ICD-10-CM | POA: Diagnosis not present

## 2016-12-18 DIAGNOSIS — L84 Corns and callosities: Secondary | ICD-10-CM | POA: Diagnosis not present

## 2016-12-18 DIAGNOSIS — R238 Other skin changes: Secondary | ICD-10-CM | POA: Diagnosis not present

## 2016-12-18 NOTE — Progress Notes (Signed)
Name: Nicholas Bautista   MRN: [id number]    DOB: [birth year]   Date:12/18/2016       Progress Note  Subjective  Chief Complaint  Chief Complaint  Patient presents with  . Toe Injury    rt big toe has a blister, will not clear up happens when he uses his walker has been there since May 16th                                                                                                                     . Inguinal Hernia    rt side was discussed during previous appt 03/18/15    HPI  Inguinal hernia: no recent problems or concerns, but mother would like it to be re-checked today  Corn/blister:L he uses walker at home at times, and about one month ago there was a blister formation of first right toe and since the blister rupture he still has an area of hematoma, does not seem to be tender.    Patient Active Problem List   Diagnosis Date Noted  . Muscle spasm 03/18/2015  . Wheelchair bound 03/18/2015  . Inguinal hernia, right 03/18/2015  . Cerebral palsy (HCC) 03/16/2015  . Perennial allergic rhinitis 03/16/2015  . Chronic constipation 03/16/2015  . Insomnia, transient 03/16/2015  . Vitamin D deficiency 03/16/2015    Past Surgical History:  Procedure Laterality Date  . ADENOIDECTOMY    . DENTAL SURGERY  07/30/2011  . TYMPANOSTOMY TUBE PLACEMENT      Family History  Problem Relation Age of Onset  . Obesity Mother   . Peptic Ulcer Father   . Diabetes Paternal Aunt   . CAD Maternal Aunt   . Diabetes Maternal Uncle   . Diabetes Maternal Grandmother   . CAD Paternal Grandmother     Social History   Social History  . Marital status: Single    Spouse name: N/A  . Number of children: N/A  . Years of education: N/A   Occupational History  . disabled    Social History Main Topics  . Smoking status: Never Smoker  . Smokeless tobacco: Never Used  . Alcohol use No  . Drug use: No  . Sexual activity: No   Other Topics Concern  . Not on file   Social History  Narrative   Lives with mother, sister and niece     Current Outpatient Prescriptions:  .  cyclobenzaprine (FLEXERIL) 10 MG tablet, Take 1 tablet (10 mg total) by mouth 3 (three) times daily., Disp: 90 tablet, Rfl: 5 .  diazepam (VALIUM) 5 MG tablet, Take 0.5-1 tablets (2.5-5 mg total) by mouth daily as needed for anxiety., Disp: 30 tablet, Rfl: 0 .  ibuprofen (ADVIL,MOTRIN) 600 MG tablet, Take 1 tablet (600 mg total) by mouth 3 (three) times daily as needed., Disp: 60 tablet, Rfl: 0 .  loratadine (CLARITIN) 10 MG tablet, Take 1 tablet (10 mg total) by mouth daily., Disp: 30 tablet, Rfl: 11 .  MULTIPLE VITAMIN PO,  Take 1 tablet by mouth daily., Disp: , Rfl:  .  olopatadine (PATANOL) 0.1 % ophthalmic solution, Place 1 drop into both eyes 2 (two) times daily., Disp: 5 mL, Rfl: 5 .  polyethylene glycol powder (GLYCOLAX/MIRALAX) powder, MIX ONE CAPFUL (17 G) IN 8 OUNCES OF FLUIDS AND DRINK ONCE DAILY, Disp: 527 g, Rfl: 5 .  temazepam (RESTORIL) 30 MG capsule, Take 1 capsule (30 mg total) by mouth at bedtime as needed. for sleep, Disp: 30 capsule, Rfl: 5  Allergies  Allergen Reactions  . Levofloxacin      ROS  Constitutional: Negative for fever or weight change.  Respiratory: Negative for cough and shortness of breath.  Cardiovascular: Negative for chest pain or palpitations.  Gastrointestinal: Negative for abdominal pain, no bowel changes.  Musculoskeletal:Positive for gait problem but no joint swelling.  Skin: Negative for rash.  Neurological: Negative for dizziness or headache.  No other specific complaints in a complete review of systems (except as listed in HPI   Objective  Vitals:   12/18/16 1054  BP: 124/68  Pulse: 95  Resp: 16  Temp: 98 F (36.7 C)  SpO2: 98%    There is no height or weight on file to calculate BMI.  Physical Exam  Constitutional: Patient appears well-developed and thin. Increase muscle tone, invonluntary movements secondary to spasms. No distress.   HEENT: head atraumatic, normocephalic, pupils equal and reactive to light, , neck supple, throat within normal limits Cardiovascular: Normal rate, regular rhythm and normal heart sounds. No murmur heard. No BLE edema. Pulmonary/Chest: Effort normal and breath sounds normal. No respiratory distress. Abdominal: Soft. There is no tenderness.difficulty exam, but seems to have bilateral small hernias, not- incarcerated or tender.  Psychiatric: Seems happy, cooperative Muscular Skeletal: increase in tonus, spastic, sitting on wheelchair Skin: he has hypopigmentation on first toe from childhood injury, on the base of big toe right he has an area where there was a blister - but now just old scar with some hematoma - explained it will take a long time to clear    PHQ2/9: Depression screen [facility] 2/9 03/26/2016 12/05/2015 08/31/2015 03/18/2015  Decreased Interest 0 0 0 0  Down, Depressed, Hopeless 0 0 0 0  PHQ - 2 Score 0 0 0 0    Fall Risk: Fall Risk  03/26/2016 12/05/2015 08/31/2015 03/18/2015  Falls in the past year? No No No No     Assessment & Plan  1. Corn or callus  reassurance  2. Skin irritation  From recent trauma - he was using scooter and drags his foot  3. Non-recurrent bilateral inguinal hernia without obstruction or gangrene  Reassurance, and discussed red flags and when to see surgeon

## 2016-12-21 ENCOUNTER — Telehealth: Payer: Self-pay | Admitting: Family Medicine

## 2016-12-21 NOTE — Telephone Encounter (Signed)
Pt has already been measured for a wheelchair, however, the mom is needing a letter for recommendation. Please contact patient once letter is complete. Mom is hoping to pick it up Tuesday.  [phone]

## 2016-12-21 NOTE — Telephone Encounter (Signed)
It has to be the note from the last face to face encounter.

## 2017-02-19 ENCOUNTER — Emergency Department: Payer: Medicaid Other

## 2017-02-19 ENCOUNTER — Emergency Department
Admission: EM | Admit: 2017-02-19 | Discharge: 2017-02-19 | Disposition: A | Discharge status: Home / Self Care | Payer: Medicaid Other | Attending: Emergency Medicine | Admitting: Emergency Medicine

## 2017-02-19 ENCOUNTER — Encounter: Payer: Self-pay | Admitting: Emergency Medicine

## 2017-02-19 DIAGNOSIS — W050XXA Fall from non-moving wheelchair, initial encounter: Secondary | ICD-10-CM | POA: Insufficient documentation

## 2017-02-19 DIAGNOSIS — Y999 Unspecified external cause status: Secondary | ICD-10-CM | POA: Insufficient documentation

## 2017-02-19 DIAGNOSIS — Z79899 Other long term (current) drug therapy: Secondary | ICD-10-CM | POA: Diagnosis not present

## 2017-02-19 DIAGNOSIS — Y9389 Activity, other specified: Secondary | ICD-10-CM | POA: Insufficient documentation

## 2017-02-19 DIAGNOSIS — G809 Cerebral palsy, unspecified: Secondary | ICD-10-CM | POA: Insufficient documentation

## 2017-02-19 DIAGNOSIS — Y92811 Bus as the place of occurrence of the external cause: Secondary | ICD-10-CM | POA: Insufficient documentation

## 2017-02-19 DIAGNOSIS — S0083XA Contusion of other part of head, initial encounter: Secondary | ICD-10-CM | POA: Insufficient documentation

## 2017-02-19 DIAGNOSIS — S0992XA Unspecified injury of nose, initial encounter: Secondary | ICD-10-CM | POA: Diagnosis present

## 2017-02-19 DIAGNOSIS — W19XXXA Unspecified fall, initial encounter: Secondary | ICD-10-CM

## 2017-02-19 NOTE — ED Notes (Signed)
Mother verbalizes understanding of d/c teaching. Pt vs stable, NAD ntoed at this time

## 2017-02-19 NOTE — ED Triage Notes (Signed)
While getting off of bus today, fell forward from wheelchair and hit bridge of nose of wheelchair lift.  Small superficial wound to bridge of nose seen.  Bleeding controlled.

## 2017-02-19 NOTE — ED Provider Notes (Signed)
[facility] Emergency Department Provider Note  ____________________________________________   I have reviewed the triage vital signs and the nursing notes.   HISTORY  Chief Complaint Fall and Facial Injury    HPI Nicholas Bautista is a 31 y.o. male who presents today complaining of a fall. Patient had a witnessed fall out of his wheelchair. He has 3 palsy. He is at his baseline otherwise. He did hit his head. He had some shoulder pain as well. Did not pass out as far as is known.. Patient himself has no complaints aside from a superficial abrasion to his nose. They're concerned maybe there is a broken nose. Most of history is per family. They can understand patient when he speaks but I have difficulty.       Past Medical History:  Diagnosis Date  . Allergy   . Cerebral palsy [facility])     Patient Active Problem List   Diagnosis Date Noted  . Muscle spasm 03/18/2015  . Wheelchair bound 03/18/2015  . Inguinal hernia, right 03/18/2015  . Cerebral palsy (HCC) 03/16/2015  . Perennial allergic rhinitis 03/16/2015  . Chronic constipation 03/16/2015  . Insomnia, transient 03/16/2015  . Vitamin D deficiency 03/16/2015    Past Surgical History:  Procedure Laterality Date  . ADENOIDECTOMY    . DENTAL SURGERY  07/30/2011  . TYMPANOSTOMY TUBE PLACEMENT      Prior to Admission medications   Medication Sig Start Date End Date Taking? Authorizing Provider  cyclobenzaprine (FLEXERIL) 10 MG tablet Take 1 tablet (10 mg total) by mouth 3 (three) times daily. 10/17/16   Alba Cory, MD  diazepam (VALIUM) 5 MG tablet Take 0.5-1 tablets (2.5-5 mg total) by mouth daily as needed for anxiety. 10/17/16   Alba Cory, MD  ibuprofen (ADVIL,MOTRIN) 600 MG tablet Take 1 tablet (600 mg total) by mouth 3 (three) times daily as needed. 10/17/16   Alba Cory, MD  loratadine (CLARITIN) 10 MG tablet Take 1 tablet (10 mg total) by mouth daily. 03/18/15   Alba Cory,  MD  MULTIPLE VITAMIN PO Take 1 tablet by mouth daily.    [provider]  olopatadine (PATANOL) 0.1 % ophthalmic solution Place 1 drop into both eyes 2 (two) times daily. 10/17/16   Sowles, Danna Hefty, MD  polyethylene glycol powder (GLYCOLAX/MIRALAX) powder MIX ONE CAPFUL (17 G) IN 8 OUNCES OF FLUIDS AND DRINK ONCE DAILY 12/23/15   Alba Cory, MD  temazepam (RESTORIL) 30 MG capsule Take 1 capsule (30 mg total) by mouth at bedtime as needed. for sleep 10/17/16   Alba Cory, MD    Allergies Levofloxacin  Family History  Problem Relation Age of Onset  . Obesity Mother   . Peptic Ulcer Father   . Diabetes Paternal Aunt   . CAD Maternal Aunt   . Diabetes Maternal Uncle   . Diabetes Maternal Grandmother   . CAD Paternal Grandmother     Social History Social History  Substance Use Topics  . Smoking status: Never Smoker  . Smokeless tobacco: Never Used  . Alcohol use No    Review of Systems Constitutional: No fever/chills Eyes: No visual changes. ENT: No sore throat. No stiff neck no neck pain Cardiovascular: Denies chest pain. Respiratory: Denies shortness of breath. Gastrointestinal:   no vomiting.  No diarrhea.  No constipation. Genitourinary: Negative for dysuria. Musculoskeletal: Negative lower extremity swelling Skin: Negative for rash. Neurological: Negative for severe headaches, focal weakness or numbness.   ____________________________________________   PHYSICAL EXAM:  VITAL  SIGNS: ED Triage Vitals  Enc Vitals Group     BP 02/19/17 1538 139/81     Pulse Rate 02/19/17 1538 (!) 106     Resp 02/19/17 1538 18     Temp 02/19/17 1538 (!) 97.3 F (36.3 C)     Temp Source 02/19/17 1538 Oral     SpO2 02/19/17 1538 100 %     Weight 02/19/17 1537 106 lb (48.1 kg)     Height 02/19/17 1537 5\' 3"  (1.6 m)     Head Circumference --      Peak Flow --      Pain Score --      Pain Loc --      Pain Edu? --      Excl. in GC? --     Constitutional: Alert  and orientedAt baseline. Well appearing and in no acute distress. Eyes: Conjunctivae are normal Head: Atraumatic HEENT: No congestion/rhinnorhea. Mucous membranes are moist.  Oropharynx non-erythematous, very superficial abrasion to the bridge of the nose Neck:   Nontender with no meningismus, no masses, no stridor Cardiovascular: Normal rate, regular rhythm. Grossly normal heart sounds.  Good peripheral circulation. Respiratory: Normal respiratory effort.  No retractions. Lungs CTAB. Abdominal: Soft and nontender. No distention. No guarding no rebound Back:  There is no focal tenderness or step off.  there is no midline tenderness there are no lesions noted. there is no CVA tenderness Musculoskeletal: No lower extremity tenderness, no upper extremity tenderness. No joint effusions, no DVT signs strong distal pulses no edema Neurologic:  Based on cervical palsy noted, spastic motions etc. speech is slurred but at baseline per family  Skin:  Skin is warm, dry and intact. No rash noted. Psychiatric: Mood and affect are normal.   ____________________________________________   LABS (all labs ordered are listed, but only abnormal results are displayed)  Labs Reviewed - No data to display ____________________________________________  EKG  I personally interpreted any EKGs ordered by me or triage  ____________________________________________  RADIOLOGY  I reviewed any imaging ordered by me or triage that were performed during my shift and, if possible, patient and/or family made aware of any abnormal findings. ____________________________________________   PROCEDURES  Procedure(s) performed: None  Procedures  Critical Care performed: None  ____________________________________________   INITIAL IMPRESSION / ASSESSMENT AND PLAN / ED COURSE  Pertinent labs & imaging results that were available during my care of the patient were reviewed by me and considered in my medical decision  making (see chart for details).  Patient with a non-syncopal fall with closed head injury. He appears to be at his baseline according to family is very difficult for me to assess. They would like me to further rule out possibility of nasal fracture. I have low suspicion however, given that I cannot fully appreciate the patient's neurologic subtle disease given his baseline deficits I will obtain CT of the head for this close head injury and nasal bones. We will assess tetanus status, and we'll x-ray his shoulder. He is in no acute distress at very low suspicion for shoulder fracture but apparently there was some pain there and again very difficult to assess    ____________________________________________   FINAL CLINICAL IMPRESSION(S) / ED DIAGNOSES  Final diagnoses:  None      This chart was dictated using voice recognition software.  Despite best efforts to proofread,  errors can occur which can change meaning.      Jeanmarie Plant, MD 02/19/17 1626

## 2017-02-19 NOTE — ED Notes (Signed)
Patient transported to X-ray 

## 2017-03-06 ENCOUNTER — Other Ambulatory Visit: Payer: Self-pay | Admitting: Family Medicine

## 2017-03-06 NOTE — Telephone Encounter (Signed)
Patient requesting refill of Miralax to [facility].

## 2017-03-27 ENCOUNTER — Encounter: Payer: Self-pay | Admitting: Family Medicine

## 2017-03-27 ENCOUNTER — Ambulatory Visit (INDEPENDENT_AMBULATORY_CARE_PROVIDER_SITE_OTHER): Payer: Medicaid Other | Admitting: Family Medicine

## 2017-03-27 VITALS — BP 110/70 | HR 107 | Temp 98.3°F | Resp 18 | Ht 63.0 in | Wt 104.0 lb

## 2017-03-27 DIAGNOSIS — Z993 Dependence on wheelchair: Secondary | ICD-10-CM

## 2017-03-27 DIAGNOSIS — J029 Acute pharyngitis, unspecified: Secondary | ICD-10-CM

## 2017-03-27 DIAGNOSIS — G802 Spastic hemiplegic cerebral palsy: Secondary | ICD-10-CM

## 2017-03-27 DIAGNOSIS — J3089 Other allergic rhinitis: Secondary | ICD-10-CM

## 2017-03-27 DIAGNOSIS — M542 Cervicalgia: Secondary | ICD-10-CM | POA: Diagnosis not present

## 2017-03-27 DIAGNOSIS — M62838 Other muscle spasm: Secondary | ICD-10-CM

## 2017-03-27 LAB — POCT RAPID STREP A (OFFICE): RAPID STREP A SCREEN: NEGATIVE

## 2017-03-27 MED ORDER — MENTHOL (TOPICAL ANALGESIC) 4 % EX GEL: 1.0000 "application " | Freq: Two times a day (BID) | CUTANEOUS | 1 refills | Status: DC

## 2017-03-27 NOTE — Progress Notes (Addendum)
Name: Nicholas Bautista   MRN: [id number]    DOB: 02-Jan-1986   Date:03/27/2017       Progress Note  Subjective  Chief Complaint  Chief Complaint  Patient presents with  . Neck Pain  . Shoulder Pain    left     HPI  - Patient presents with his mother, Luster Landsberg, who provides most of the history for the patient.  For the last 2-3 weeks patient has been complaining of LEFT sided neck pain. Pt has cerebral palsey, is mostly wheelchair bound (crawls at home sometimes), and at last visit, was told to increase flexeril to TID PRN.  He has taken Flexeril and Motrin today and this seemed to help some, however he has not been taking Flexeril TID for the pain - encouraged to use as prescribed as needed for this pain.  He sees physical therapy at his day program about twice a month.  No behavior changes, changes in baseline extremity strength, recent falls, chest pain, shortness of breath, or nausea/vomiting; mother does note that he takes a deep breath before raising his LEFT upper extremity because he is anticipating pain.  - Patient was seen in ER for fall from wheelchair on 02/19/2017 - RIGHT shoulder had imaging and was normal. Pt was not complaining of neck pain during that time; pain started at least a week after this incident per mother.  - He also has had increased nasal congestion and sore throat over the last several weeks which has been mostly relieved by OTC Claritin.  He does have history of perennial allergies.  No vision changes, cough, fevers, chills, or ear pain/pressure.  Patient Active Problem List   Diagnosis Date Noted  . Muscle spasm 03/18/2015  . Wheelchair bound 03/18/2015  . Inguinal hernia, right 03/18/2015  . Cerebral palsy (HCC) 03/16/2015  . Perennial allergic rhinitis 03/16/2015  . Chronic constipation 03/16/2015  . Insomnia, transient 03/16/2015  . Vitamin D deficiency 03/16/2015    Social History  Substance Use Topics  . Smoking status: Never Smoker  . Smokeless  tobacco: Never Used  . Alcohol use No     Current Outpatient Prescriptions:  .  cyclobenzaprine (FLEXERIL) 10 MG tablet, Take 1 tablet (10 mg total) by mouth 3 (three) times daily., Disp: 90 tablet, Rfl: 5 .  diazepam (VALIUM) 5 MG tablet, Take 0.5-1 tablets (2.5-5 mg total) by mouth daily as needed for anxiety., Disp: 30 tablet, Rfl: 0 .  ibuprofen (ADVIL,MOTRIN) 600 MG tablet, Take 1 tablet (600 mg total) by mouth 3 (three) times daily as needed., Disp: 60 tablet, Rfl: 0 .  loratadine (CLARITIN) 10 MG tablet, Take 1 tablet (10 mg total) by mouth daily., Disp: 30 tablet, Rfl: 11 .  MULTIPLE VITAMIN PO, Take 1 tablet by mouth daily., Disp: , Rfl:  .  olopatadine (PATANOL) 0.1 % ophthalmic solution, Place 1 drop into both eyes 2 (two) times daily., Disp: 5 mL, Rfl: 5 .  polyethylene glycol powder (GLYCOLAX/MIRALAX) powder, MIX ONE CAPFUL (17G) IN 8 OUNCES OF FLUIDS AND DRINK BY MOUTH ONCE DAILY, Disp: 527 g, Rfl: 5 .  temazepam (RESTORIL) 30 MG capsule, Take 1 capsule (30 mg total) by mouth at bedtime as needed. for sleep, Disp: 30 capsule, Rfl: 5  Allergies  Allergen Reactions  . Levofloxacin     ROS  Ten systems reviewed and is negative except as mentioned in HPI  Objective  Vitals:   03/27/17 1343  BP: 110/70  Pulse: (!) 107  Resp:  18  Temp: 98.3 F (36.8 C)  TempSrc: Oral  SpO2: 94%  Weight: 104 lb (47.2 kg)  Height:  (1.6 m)   Body mass index is 18.42 kg/m.  Nursing Note and Vital Signs reviewed.  Physical Exam  Constitutional: Patient appears well-developed and well-nourished. No distress.  HEENT: head atraumatic, normocephalic, pupils equal and reactive to light, EOM's intact, TM's without erythema or bulging, no maxillary or frontal sinus pain on palpation, neck supple without lymphadenopathy, oropharynx mildly erythematous with small amount of white exudate - question poor dental hygiene vs true exudate - we will swab for strep to be sure; moderate  cobblestone appearance of OP. Cardiovascular: Normal rate, regular rhythm, S1/S2 present.  No murmur or rub heard. No BLE edema. Pulmonary/Chest: Effort normal and breath sounds clear. No respiratory distress or retractions. Psychiatric: Patient has a normal mood and affect. behavior is normal. Judgment and thought content normal. MSK: Baseline AROM of BUE, moderate spasticity present. Pt able to raise BUE above head. No bony tenderness to LUE; LEFT Neck musculature exhibits moderate tensity, no palpable spasm and is minimally tender on palpation.  No joint effusions or bony tenderness.  No results found for this or any previous visit (from the past 2160 hour(s)).   Assessment & Plan  1. Perennial allergic rhinitis - Advised symptoms may be related to increased allergens during the fall season with eustachian tube dysfunction.  Recommend continuing Claritin daily and starting flonase daily - mother declines Rx and says she has some at home that he may use.   2. Muscle spasm - Menthol, Topical Analgesic, (BIOFREEZE ROLL-ON) 4 % GEL; Apply 1 application topically 2 (two) times daily.  Dispense: 89 mL; Refill: 1 - Recommend gentle stretching as tolerated and gentle heat only when supervised and awake.  We discussed dangers of heating pads at length, mother is agreeable to using hot water bottle. - Continue PT  3. Wheelchair bound - No recent falls, however advised this may contribute to his neck and shoulder pain, recommend frequent repositioning, good neck support while in wheelchair/stroller and while in bed.  4. Neck pain on left side - Menthol, Topical Analgesic, (BIOFREEZE ROLL-ON) 4 % GEL; Apply 1 application topically 2 (two) times daily.  Dispense: 89 mL; Refill: 1  5. Sore throat - POCT rapid strep A - Negative - Advised symptoms may be related to increased allergens during the fall season with eustachian tube dysfunction.  Recommend continuing Claritin daily and starting flonase  daily - mother declines Rx and says she has some at home that he may use.  - Recommend good oral hygiene 2-3 times daily.  6. Spastic hemiplegic cerebral palsy (HCC) - Recommend gentle stretching as tolerated and gentle heat only when supervised and awake.  We discussed dangers of heating pads at length, mother is agreeable to using hot water bottle. - Continue PT - Advised to take Flexeril TID PRN for pain as well as motrin TID PRN for pain.  -Red flags and when to present for emergency care or RTC including fever >101.60F, severe pain, chest pain, shortness of breath, new/worsening/un-resolving symptoms, reviewed with patient at time of visit. Follow up and care instructions discussed and provided in AVS.  I have reviewed this encounter including the documentation in this note and/or discussed this patient with the Deboraha Sprang, FNP, NP-C. I am certifying that I agree with the content of this note as supervising physician.  Alba Cory, MD [facility] 03/27/2017,  5:23 PM

## 2017-03-27 NOTE — Patient Instructions (Addendum)
-   Please continue to use Claritin daily. - Apply heat while patient is awake and supervised to painful area of the neck. - Continue physical therapy at day program - Gentle stretching of the neck as tolerated.

## 2017-03-28 NOTE — Addendum Note (Signed)
Addended by: Doren Custard on: 03/28/2017 07:58 AM   Modules accepted: Level of Service

## 2017-04-03 ENCOUNTER — Encounter: Payer: Self-pay | Admitting: Family Medicine

## 2017-04-03 ENCOUNTER — Ambulatory Visit (INDEPENDENT_AMBULATORY_CARE_PROVIDER_SITE_OTHER): Payer: Medicaid Other | Admitting: Family Medicine

## 2017-04-03 VITALS — BP 118/64 | HR 118 | Temp 98.0°F | Resp 16 | Ht 63.0 in | Wt 104.0 lb

## 2017-04-03 DIAGNOSIS — Z23 Encounter for immunization: Secondary | ICD-10-CM

## 2017-04-03 DIAGNOSIS — M542 Cervicalgia: Secondary | ICD-10-CM

## 2017-04-03 DIAGNOSIS — G802 Spastic hemiplegic cerebral palsy: Secondary | ICD-10-CM

## 2017-04-03 DIAGNOSIS — G4489 Other headache syndrome: Secondary | ICD-10-CM | POA: Diagnosis not present

## 2017-04-03 DIAGNOSIS — R569 Unspecified convulsions: Secondary | ICD-10-CM

## 2017-04-03 MED ORDER — METAXALONE 800 MG PO TABS: 800.0000 mg | ORAL_TABLET | Freq: Three times a day (TID) | ORAL | 2 refills | Status: DC

## 2017-04-03 NOTE — Progress Notes (Signed)
Name: Nicholas Bautista   MRN: [id number]    DOB: 12-21-1985   Date:04/03/2017       Progress Note  Subjective  Chief Complaint  Chief Complaint  Patient presents with  . Shoulder Pain    Left shoulder and neck pain, still doing Biofreeze and Ibuprofen. But still having trouble with his motor functions-getting better but still not fully functionable.  . Head    Patient told his mother his head felt funny for a hour and she wanted to discuss    HPI  Headaches/neck pain/seizure disorder: mother brought him in today concerned about possible seizure episode yesterday. She states he was awake and alert but kept patting his own head, she asked if it was pain and he said no, but he said yes for feeling funny in his head. She gave him ibuprofen and half valium and symptoms resolved within 30 minutes. He has been acting his normal self since. No other symptoms. Normal appetite, no fever.   Left shoulder pain: he was seen by by Maurice Small on 03/27/2017, he uses left arm to go up and down the stairs and also to get out of the bathtub. Mother is keeping him either upstairs or downstairs to allow his shoulder to heal. Taking medication prn now, neck and shoulder seems to be helping.   Cerebral palsy: mother would like to try a different type of muscle relaxer to see if helps better with spasms, he has been on flexeril for a long time. Mother is wondering if another medication would help more.   Patient Active Problem List   Diagnosis Date Noted  . Muscle spasm 03/18/2015  . Wheelchair bound 03/18/2015  . Inguinal hernia, right 03/18/2015  . Cerebral palsy (HCC) 03/16/2015  . Perennial allergic rhinitis 03/16/2015  . Chronic constipation 03/16/2015  . Insomnia, transient 03/16/2015  . Vitamin D deficiency 03/16/2015    Past Surgical History:  Procedure Laterality Date  . ADENOIDECTOMY    . DENTAL SURGERY  07/30/2011  . TYMPANOSTOMY TUBE PLACEMENT      Family History  Problem Relation Age  of Onset  . Obesity Mother   . Peptic Ulcer Father   . Diabetes Paternal Aunt   . CAD Maternal Aunt   . Diabetes Maternal Uncle   . Diabetes Maternal Grandmother   . CAD Paternal Grandmother     Social History   Social History  . Marital status: Single    Spouse name: N/A  . Number of children: N/A  . Years of education: N/A   Occupational History  . disabled    Social History Main Topics  . Smoking status: Never Smoker  . Smokeless tobacco: Never Used  . Alcohol use No  . Drug use: No  . Sexual activity: No   Other Topics Concern  . Not on file   Social History Narrative   Lives with mother, sister and niece     Current Outpatient Prescriptions:  .  diazepam (VALIUM) 5 MG tablet, Take 0.5-1 tablets (2.5-5 mg total) by mouth daily as needed for anxiety., Disp: 30 tablet, Rfl: 0 .  ibuprofen (ADVIL,MOTRIN) 600 MG tablet, Take 1 tablet (600 mg total) by mouth 3 (three) times daily as needed., Disp: 60 tablet, Rfl: 0 .  loratadine (CLARITIN) 10 MG tablet, Take 1 tablet (10 mg total) by mouth daily., Disp: 30 tablet, Rfl: 11 .  Menthol, Topical Analgesic, (BIOFREEZE ROLL-ON) 4 % GEL, Apply 1 application topically 2 (two) times daily., Disp: 26  mL, Rfl: 1 .  MULTIPLE VITAMIN PO, Take 1 tablet by mouth daily., Disp: , Rfl:  .  olopatadine (PATANOL) 0.1 % ophthalmic solution, Place 1 drop into both eyes 2 (two) times daily., Disp: 5 mL, Rfl: 5 .  polyethylene glycol powder (GLYCOLAX/MIRALAX) powder, MIX ONE CAPFUL (17G) IN 8 OUNCES OF FLUIDS AND DRINK BY MOUTH ONCE DAILY, Disp: 527 g, Rfl: 5 .  temazepam (RESTORIL) 30 MG capsule, Take 1 capsule (30 mg total) by mouth at bedtime as needed. for sleep, Disp: 30 capsule, Rfl: 5 .  metaxalone (SKELAXIN) 800 MG tablet, Take 1 tablet (800 mg total) by mouth 3 (three) times daily., Disp: 90 tablet, Rfl: 2  Allergies  Allergen Reactions  . Levofloxacin      ROS  Constitutional: Negative for fever or weight change.   Respiratory: Negative for cough and shortness of breath.   Cardiovascular: Negative for chest pain or palpitations.  Gastrointestinal: Negative for abdominal pain, no bowel changes.  Musculoskeletal: Positive  for gait problem no  joint swelling.  Skin: Negative for rash.   Neurological: Negative for dizziness, positive for headache yesterday .  No other specific complaints in a complete review of systems (except as listed in HPI above).  Objective  Vitals:   04/03/17 1454  BP: 118/64  Pulse: (!) 118  Resp: 16  Temp: 98 F (36.7 C)  TempSrc: Oral  SpO2: 97%  Weight: 104 lb (47.2 kg)  Height:  (1.6 m)    Body mass index is 18.42 kg/m.  Physical Exam  Constitutional: Patient appears well-developed and thin. Increase muscle tone, invonluntary movements secondary to spasms. No distress.  HEENT: head atraumatic, normocephalic, pupils equal and reactive to light, , neck supple, throat within normal limits Cardiovascular: Normal rate, regular rhythm and normal heart sounds. No murmur heard. No BLE edema. Pulmonary/Chest: Effort normal and breath sounds normal. No respiratory distress. Abdominal: Soft. There is no tenderness.difficulty exam, but seems to have bilateral small hernias, not- incarcerated or tender.  Psychiatric: Seems happy, cooperative Muscular Skeletal: increase in tonus, spastic, sitting on wheelchair   Recent Results (from the past 2160 hour(s))  POCT rapid strep A     Status: None   Collection Time: 03/27/17  2:35 PM  Result Value Ref Range   Rapid Strep A Screen Negative Negative      PHQ2/9: Depression screen [facility] 2/9 03/27/2017 03/26/2016 12/05/2015 08/31/2015 03/18/2015  Decreased Interest 0 0 0 0 0  Down, Depressed, Hopeless 0 0 0 0 0  PHQ - 2 Score 0 0 0 0 0     Fall Risk: Fall Risk  03/27/2017 03/26/2016 12/05/2015 08/31/2015 03/18/2015  Falls in the past year? No No No No No  Risk for fall due to : Impaired mobility - - - -     Functional  Status Survey: Is the patient deaf or have difficulty hearing?: No Does the patient have difficulty seeing, even when wearing glasses/contacts?: No Does the patient have difficulty concentrating, remembering, or making decisions?: No Does the patient have difficulty walking or climbing stairs?: Yes Does the patient have difficulty dressing or bathing?: Yes Does the patient have difficulty doing errands alone such as visiting a doctor's office or shopping?: Yes    Assessment & Plan  1. Other headache syndrome  It may be the cause of his symptoms , it resolved with muscle relaxer and ibuprofen  2. Needs flu shot  - Flu Vaccine QUAD 6+ mos PF IM (Fluarix Quad PF)  3. Neck pain on left side  Still has medication at home and is doing well   4. Seizure (HCC)  Unlikely the cause of his symptoms. 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 Spastic hemiplegic cerebral palsy (HCC)  - metaxalone (SKELAXIN) 800 MG tablet; Take 1 tablet (800 mg total) by mouth 3 (three) times daily.  Dispense: 90 tablet; Refill: 2

## 2017-04-04 ENCOUNTER — Other Ambulatory Visit: Payer: Self-pay

## 2017-04-04 MED ORDER — TIZANIDINE HCL 4 MG PO TABS: 4.0000 mg | ORAL_TABLET | Freq: Three times a day (TID) | ORAL | 2 refills | Status: AC

## 2017-04-04 NOTE — Telephone Encounter (Signed)
Skelaxin is non-preferred medication under Medicaid list, it is $321.20 without PA. Pharmacy is asking we change medication or PA. Dr. Carlynn Purl verbalized to change to tizanidine tablet tid.

## 2017-04-30 ENCOUNTER — Ambulatory Visit (INDEPENDENT_AMBULATORY_CARE_PROVIDER_SITE_OTHER): Payer: Medicaid Other | Admitting: Family Medicine

## 2017-04-30 ENCOUNTER — Encounter: Payer: Self-pay | Admitting: Family Medicine

## 2017-04-30 VITALS — BP 140/90 | HR 62 | Temp 98.0°F | Resp 14 | Ht 63.0 in | Wt 104.0 lb

## 2017-04-30 DIAGNOSIS — J309 Allergic rhinitis, unspecified: Secondary | ICD-10-CM

## 2017-04-30 DIAGNOSIS — H1013 Acute atopic conjunctivitis, bilateral: Secondary | ICD-10-CM

## 2017-04-30 DIAGNOSIS — Z0000 Encounter for general adult medical examination without abnormal findings: Secondary | ICD-10-CM | POA: Diagnosis not present

## 2017-04-30 MED ORDER — OLOPATADINE HCL 0.1 % OP SOLN: 1.0000 [drp] | Freq: Two times a day (BID) | OPHTHALMIC | 5 refills | Status: DC
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
## 2017-04-30 NOTE — Patient Instructions (Signed)
Preventive Care 18-39 Years, Male Preventive care refers to lifestyle choices and visits with your health care provider that can promote health and wellness. What does preventive care include?  A yearly physical exam. This is also called an annual well check.  Dental exams once or twice a year.  Routine eye exams. Ask your health care provider how often you should have your eyes checked.  Personal lifestyle choices, including: ? Daily care of your teeth and gums. ? Regular physical activity. ? Eating a healthy diet. ? Avoiding tobacco and drug use. ? Limiting alcohol use. ? Practicing safe sex. What happens during an annual well check? The services and screenings done by your health care provider during your annual well check will depend on your age, overall health, lifestyle risk factors, and family history of disease. Counseling Your health care provider may ask you questions about your:  Alcohol use.  Tobacco use.  Drug use.  Emotional well-being.  Home and relationship well-being.  Sexual activity.  Eating habits.  Work and work environment.  Screening You may have the following tests or measurements:  Height, weight, and BMI.  Blood pressure.  Lipid and cholesterol levels. These may be checked every 5 years starting at age 20.  Diabetes screening. This is done by checking your blood sugar (glucose) after you have not eaten for a while (fasting).  Skin check.  Hepatitis C blood test.  Hepatitis B blood test.  Sexually transmitted disease (STD) testing.  Discuss your test results, treatment options, and if necessary, the need for more tests with your health care provider. Vaccines Your health care provider may recommend certain vaccines, such as:  Influenza vaccine. This is recommended every year.  Tetanus, diphtheria, and acellular pertussis (Tdap, Td) vaccine. You may need a Td booster every 10 years.  Varicella vaccine. You may need this if you  have not been vaccinated.  HPV vaccine. If you are 26 or younger, you may need three doses over 6 months.  Measles, mumps, and rubella (MMR) vaccine. You may need at least one dose of MMR.You may also need a second dose.  Pneumococcal 13-valent conjugate (PCV13) vaccine. You may need this if you have certain conditions and have not been vaccinated.  Pneumococcal polysaccharide (PPSV23) vaccine. You may need one or two doses if you smoke cigarettes or if you have certain conditions.  Meningococcal vaccine. One dose is recommended if you are age 19-21 years and a first-year college student living in a residence hall, or if you have one of several medical conditions. You may also need additional booster doses.  Hepatitis A vaccine. You may need this if you have certain conditions or if you travel or work in places where you may be exposed to hepatitis A.  Hepatitis B vaccine. You may need this if you have certain conditions or if you travel or work in places where you may be exposed to hepatitis B.  Haemophilus influenzae type b (Hib) vaccine. You may need this if you have certain risk factors.  Talk to your health care provider about which screenings and vaccines you need and how often you need them. This information is not intended to replace advice given to you by your health care provider. Make sure you discuss any questions you have with your health care provider. Document Released: 08/14/2001 Document Revised: 03/07/2016 Document Reviewed: 04/19/2015 Elsevier Interactive Patient Education  2017 Elsevier Inc.  

## 2017-07-08 ENCOUNTER — Ambulatory Visit: Payer: Medicaid Other | Admitting: Family Medicine

## 2017-11-22 ENCOUNTER — Other Ambulatory Visit: Payer: Self-pay | Admitting: Family Medicine

## 2017-11-22 DIAGNOSIS — M62838 Other muscle spasm: Secondary | ICD-10-CM

## 2017-11-22 DIAGNOSIS — G802 Spastic hemiplegic cerebral palsy: Secondary | ICD-10-CM

## 2017-11-22 MED ORDER — DIAZEPAM 5 MG PO TABS: 2.5000 mg | ORAL_TABLET | Freq: Every day | ORAL | 0 refills | Status: DC | PRN

## 2017-11-22 NOTE — Telephone Encounter (Signed)
lvm @ 2:19 pm on # [phone]  Informing that a limited supply of his prescription has been called into his pharmacy and asked that they please give our office a call to schedule an appt

## 2017-11-22 NOTE — Telephone Encounter (Signed)
Copied from CRM [id number]. Topic: Quick Communication - Rx Refill/Question >> Nov 22, 2017 11:13 AM Floria Raveling A wrote: Medication: diazepam (VALIUM) 5 MG tablet [[id number]]   Has the patient contacted their pharmacy? No  (Agent: If no, request that the patient contact the pharmacy for the refill.) (Agent: If yes, when and what did the pharmacy advise?)  Preferred Pharmacy (with phone number or street name): [facility] [address]   Agent: Please be advised that RX refills may take up to 3 business days. We ask that you follow-up with your pharmacy.

## 2017-11-22 NOTE — Telephone Encounter (Signed)
Controlled medication, due for follow up, I will send two only

## 2017-12-25 ENCOUNTER — Encounter: Payer: Self-pay | Admitting: Nurse Practitioner

## 2017-12-25 ENCOUNTER — Ambulatory Visit (INDEPENDENT_AMBULATORY_CARE_PROVIDER_SITE_OTHER): Payer: Medicaid Other | Admitting: Nurse Practitioner

## 2017-12-25 VITALS — BP 120/70 | HR 79 | Temp 98.2°F | Resp 16

## 2017-12-25 DIAGNOSIS — G802 Spastic hemiplegic cerebral palsy: Secondary | ICD-10-CM

## 2017-12-25 DIAGNOSIS — M62838 Other muscle spasm: Secondary | ICD-10-CM | POA: Diagnosis not present

## 2017-12-25 DIAGNOSIS — M25511 Pain in right shoulder: Secondary | ICD-10-CM

## 2017-12-25 DIAGNOSIS — R0981 Nasal congestion: Secondary | ICD-10-CM | POA: Diagnosis not present

## 2017-12-25 MED ORDER — LIDO-CAPSAICIN-MEN-METHYL SAL 0.5-0.035-5-20 % EX PTCH: 1.0000 | MEDICATED_PATCH | Freq: Every day | CUTANEOUS | 1 refills | Status: DC

## 2017-12-25 MED ORDER — FLUTICASONE PROPIONATE 50 MCG/ACT NA SUSP: 2.0000 | Freq: Every day | NASAL | 6 refills | Status: DC

## 2017-12-25 MED ORDER — IBUPROFEN 600 MG PO TABS: 600.0000 mg | ORAL_TABLET | Freq: Three times a day (TID) | ORAL | 0 refills | Status: DC | PRN

## 2017-12-25 MED ORDER — DIAZEPAM 5 MG PO TABS
2.5000 mg | ORAL_TABLET | Freq: Every day | ORAL | 0 refills | Status: DC | PRN
Start: 2017-12-25 — End: 2018-08-14

## 2017-12-25 NOTE — Progress Notes (Addendum)
Name: Nicholas Bautista   MRN: [id number]    DOB: 04-28-1986   Date:12/25/2017       Progress Note  Subjective  Chief Complaint  Chief Complaint  Patient presents with  . Shoulder Pain    left shoulder    HPI  Patient presents with family member that assists with his care. He is in stroller moving both extremities. Family member endorses pt has been having some right shoulder pain, mistakenly said left shoulder. Patient was crawling up stairs and believes he hyperextended it. sts patient has high pain tolerance so continues to move arm instead of resting it. Patient endorses some right upper back pain, no tenderness when palpating shoulder, but has pain in right shoulder. Seems to be relieved with flexeril and half a valium   Patient Active Problem List   Diagnosis Date Noted  . Muscle spasm 03/18/2015  . Wheelchair bound 03/18/2015  . Inguinal hernia, right 03/18/2015  . Cerebral palsy (HCC) 03/16/2015  . Perennial allergic rhinitis 03/16/2015  . Chronic constipation 03/16/2015  . Insomnia, transient 03/16/2015  . Vitamin D deficiency 03/16/2015    Past Medical History:  Diagnosis Date  . Allergy   . Cerebral palsy [facility])     Past Surgical History:  Procedure Laterality Date  . ADENOIDECTOMY    . DENTAL SURGERY  07/30/2011  . TYMPANOSTOMY TUBE PLACEMENT      Social History   Tobacco Use  . Smoking status: Never Smoker  . Smokeless tobacco: Never Used  Substance Use Topics  . Alcohol use: No    Alcohol/week: 0.0 oz     Current Outpatient Medications:  .  diazepam (VALIUM) 5 MG tablet, Take 0.5-1 tablets (2.5-5 mg total) by mouth daily as needed for anxiety., Disp: 5 tablet, Rfl: 0 .  ibuprofen (ADVIL,MOTRIN) 600 MG tablet, Take 1 tablet (600 mg total) by mouth 3 (three) times daily as needed., Disp: 60 tablet, Rfl: 0 .  loratadine (CLARITIN) 10 MG tablet, Take 1 tablet (10 mg total) by mouth daily., Disp: 30 tablet, Rfl: 11 .  Menthol, Topical Analgesic,  (BIOFREEZE ROLL-ON) 4 % GEL, Apply 1 application topically 2 (two) times daily., Disp: 89 mL, Rfl: 1 .  MULTIPLE VITAMIN PO, Take 1 tablet by mouth daily., Disp: , Rfl:  .  olopatadine (PATANOL) 0.1 % ophthalmic solution, Place 1 drop into both eyes 2 (two) times daily., Disp: 5 mL, Rfl: 5 .  polyethylene glycol powder (GLYCOLAX/MIRALAX) powder, MIX ONE CAPFUL (17G) IN 8 OUNCES OF FLUIDS AND DRINK BY MOUTH ONCE DAILY, Disp: 527 g, Rfl: 5 .  temazepam (RESTORIL) 30 MG capsule, Take 1 capsule (30 mg total) by mouth at bedtime as needed. for sleep, Disp: 30 capsule, Rfl: 5 .  fluticasone (FLONASE) 50 MCG/ACT nasal spray, Place 2 sprays into both nostrils daily., Disp: 16 g, Rfl: 6 .  Lido-Capsaicin-Men-Methyl Sal 0.5-0.035-5-20 % PTCH, Apply 1 patch topically daily., Disp: 10 patch, Rfl: 1  Allergies  Allergen Reactions  . Levofloxacin     ROS   No other specific complaints in a complete review of systems (except as listed in HPI above).  Objective  Vitals:   12/25/17 1106  BP: 120/70  Pulse: 79  Resp: 16  Temp: 98.2 F (36.8 C)  TempSrc: Oral  SpO2: 98%     There is no height or weight on file to calculate BMI.  Nursing Note and Vital Signs reviewed.  Physical Exam  Constitutional: Patient in strolled, moving both upper extremities  spastically, smiling  HEENT: head atraumatic, normocephalic, pupils equal and reactive to light, TM's without erythema or bulging,  no maxillary or frontal sinus tenderness , no nasal discharge Cardiovascular: Normal rate,  Pulmonary/Chest: Effort normal  MSK: right hand grip strong able to lift arms up with passive ROM, spasticity normal per caregiver, no tenderness in shoulder to palpation, patient notes right shoulder posterior pain and right upper back area extending to mid back; no obvious deformities ntoed.  SKin; No rashes or redness noted in back or shoulder Psychiatric: Patient has a normal mood and affect. behavior is normal. Judgment  and thought content normal.  No results found for this or any previous visit (from the past 72 hour(s)).  Assessment & Plan  1. Muscle spasm  - ibuprofen (ADVIL,MOTRIN) 600 MG tablet; Take 1 tablet (600 mg total) by mouth 3 (three) times daily as needed.  Dispense: 60 tablet; Refill: 0 - diazepam (VALIUM) 5 MG tablet; Take 0.5-1 tablets (2.5-5 mg total) by mouth daily as needed for anxiety.  Dispense: 5 tablet; Refill: 0  2. Spastic hemiplegic cerebral palsy (HCC)  - diazepam (VALIUM) 5 MG tablet; Take 0.5-1 tablets (2.5-5 mg total) by mouth daily as needed for anxiety.  Dispense: 5 tablet; Refill: 0  3. Acute pain of right shoulder rest - ibuprofen (ADVIL,MOTRIN) 600 MG tablet; Take 1 tablet (600 mg total) by mouth 3 (three) times daily as needed.  Dispense: 60 tablet; Refill: 0 - diazepam (VALIUM) 5 MG tablet; Take 0.5-1 tablets (2.5-5 mg total) by mouth daily as needed for anxiety.  Dispense: 5 tablet; Refill: 0 - Lido-Capsaicin-Men-Methyl Sal 0.5-0.035-5-20 % PTCH; Apply 1 patch topically daily.  Dispense: 10 patch; Refill: 1 - DG Shoulder Right; Future  4. Nasal congestion  - fluticasone (FLONASE) 50 MCG/ACT nasal spray; Place 2 sprays into both nostrils daily.  Dispense: 16 g; Refill: 6   I have reviewed this encounter including the documentation in this note and/or discussed this patient with the provider, Sharyon CableElizabeth Enis Riecke DNP AGNP-C. I am certifying that I agree with the content of this note as supervising physician. Alba CoryKrichna Sowles, MD [facility] 12/25/2017, 6:38 PM

## 2017-12-25 NOTE — Patient Instructions (Signed)
-   Take tylenol ( no more than 3,000mg  a day) around the clock - Use lidocaine patch on right upper back/shoulder area - Take ibuprofen and flexaril as needed for the next 1-2 weeks for pain - Take valium very sparingly for agitation to help him rest arm  - If unimproved in one week or worsening at any time X-ray order is placed can get xray done at any time.   - Flonase sent to pharmacy as well.

## 2017-12-30 ENCOUNTER — Other Ambulatory Visit: Payer: Self-pay

## 2017-12-30 DIAGNOSIS — M25511 Pain in right shoulder: Secondary | ICD-10-CM

## 2017-12-30 MED ORDER — LIDO-CAPSAICIN-MEN-METHYL SAL 0.5-0.035-5-20 % EX PTCH: 1.0000 | MEDICATED_PATCH | Freq: Every day | CUTANEOUS | 1 refills | Status: DC

## 2017-12-30 NOTE — Telephone Encounter (Signed)
[facility] has sent a fax wanting to know what this requested medication is.  Refill request was sent to Sharyon CableElizabeth Poulose, FNP for approval and submission.

## 2017-12-30 NOTE — Telephone Encounter (Signed)
A topical OTC pain medication is fine such as lidocaine pathc.

## 2018-01-22 IMAGING — CT CT MAXILLOFACIAL W/O CM
5 of 14 series · 17 of 47 positions shown, 19 images · non-contrast
Comparison: None.

CLINICAL DATA: Fall from +. History cerebral palsy. Skeletal
wheelchair.

EXAM:
CT HEAD WITHOUT CONTRAST
CT MAXILLOFACIAL WITHOUT CONTRAST
TECHNIQUE: Multidetector CT imaging of the head and maxillofacial structures
were performed using the standard protocol without intravenous
contrast. Multiplanar CT image reconstructions of the maxillofacial
structures were also generated.

[Series 3: head bone · axial · 0.44mm/px · z∈[+456,+552]mm · 5 of 73 slices shown, 7 images (1 of 2)]
[im 13/73  brain]
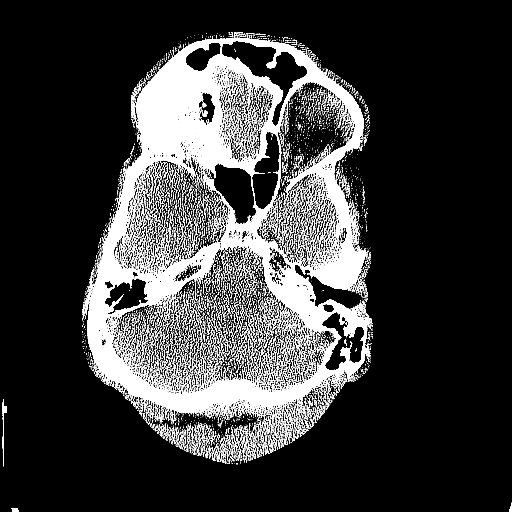
[im 13/73  bone]
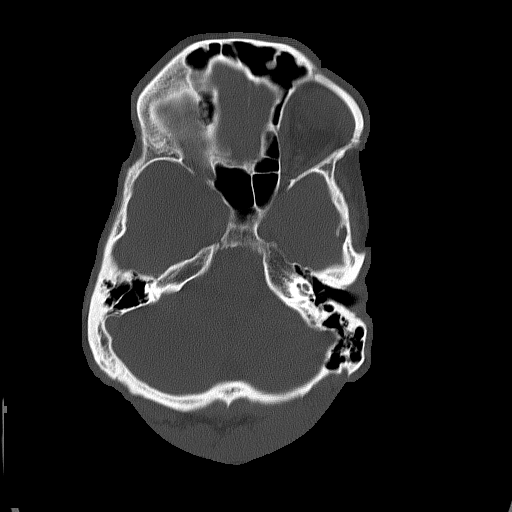
[im 25/73  bone]
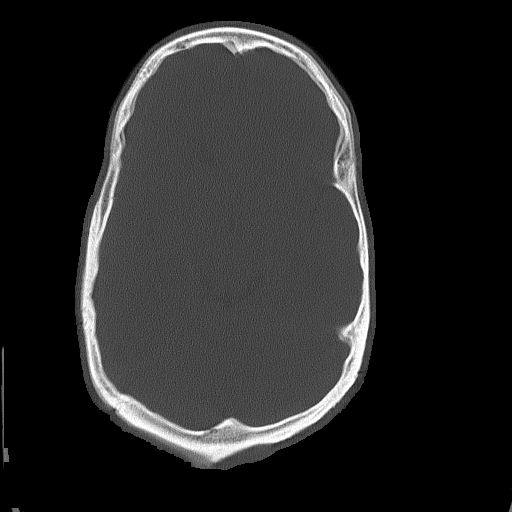
[im 37/73  bone]
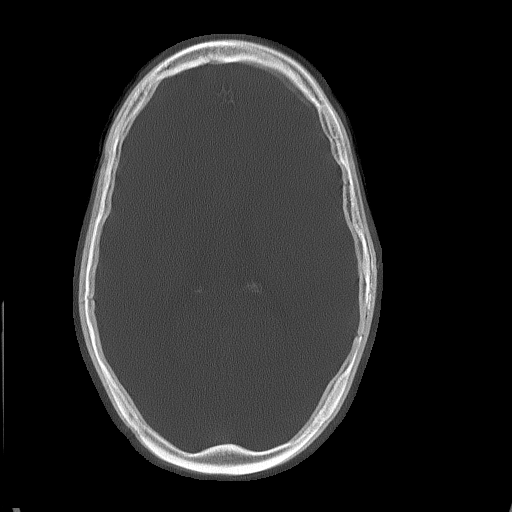
[im 49/73  bone]
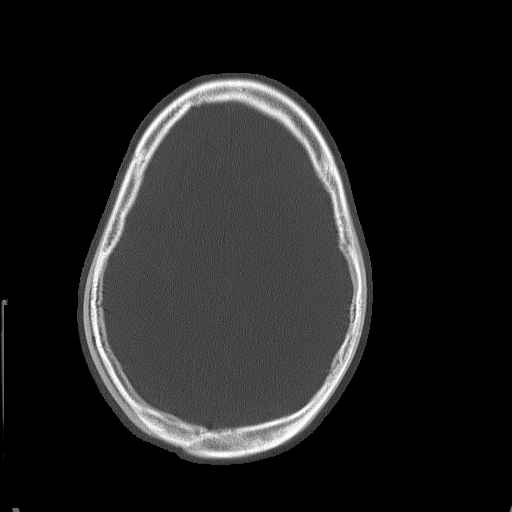
[im 61/73  brain]
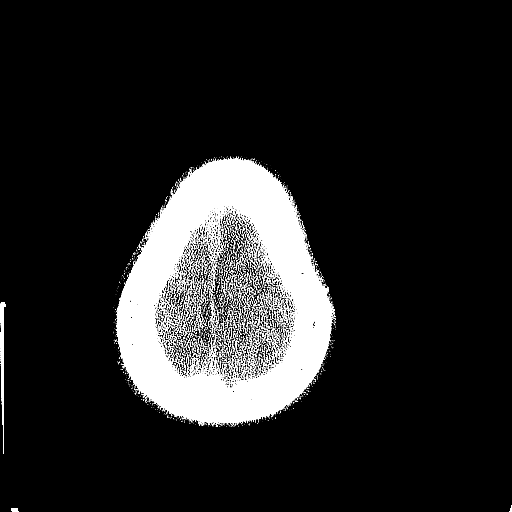
[im 61/73  bone]
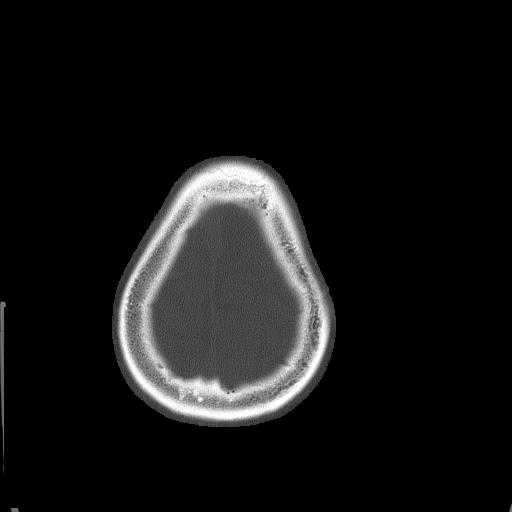

[Series 7: head bone · axial · 0.44mm/px · z∈[+460,+546]mm · 4 of 73 slices shown (2 of 2)]
[im 15/73  bone]
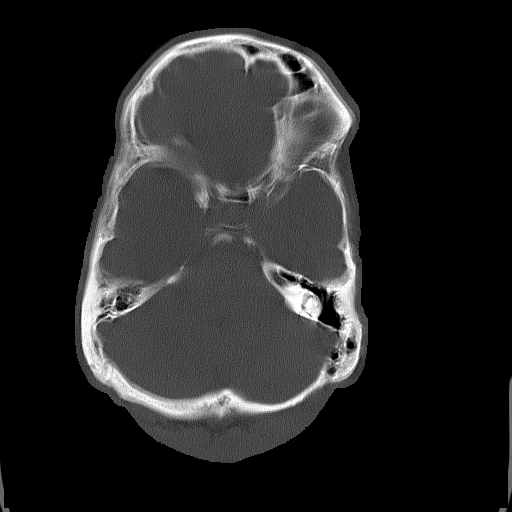
[im 29/73  bone]
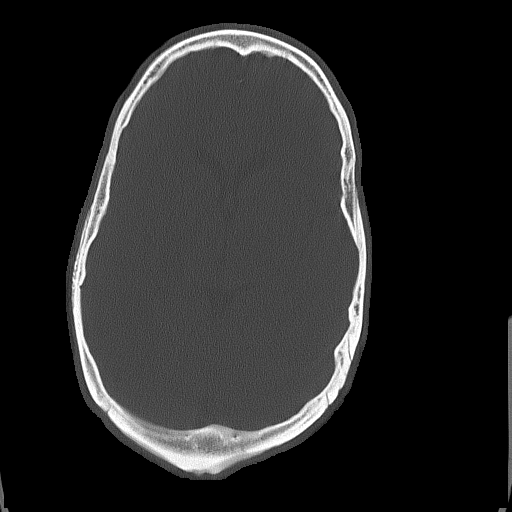
[im 44/73  bone]
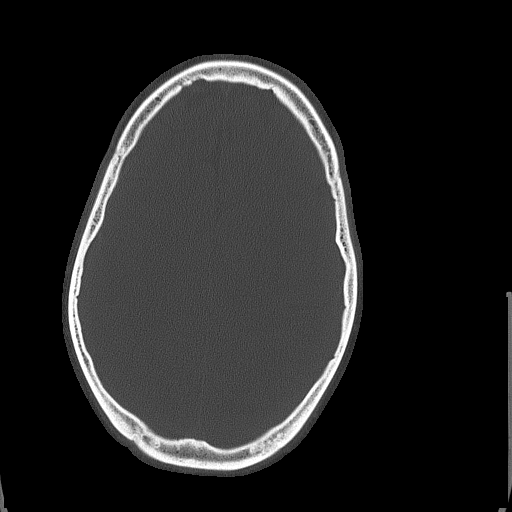
[im 58/73  bone]
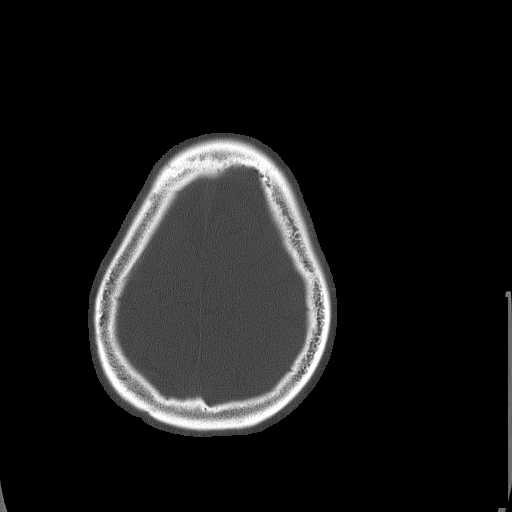

[Series 8: coronal soft tissue · coronal · 0.28mm/px · 1 of 67 slices shown]
[im 34/67  bone]
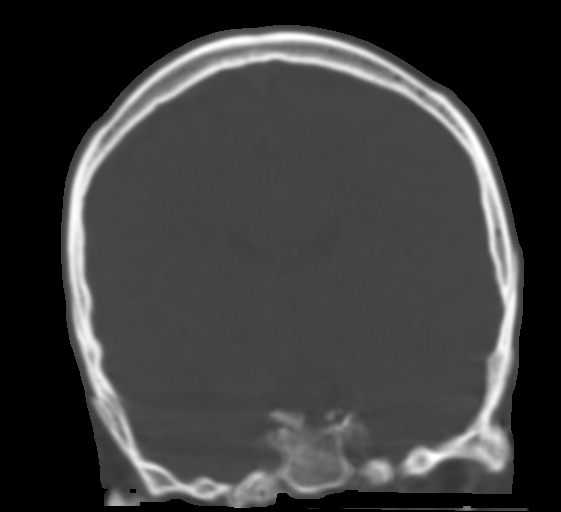

[Series 10: max soft · axial · 0.37mm/px · z∈[+327,+439]mm · 5 of 85 slices shown (1 of 2)]
[im 15/85  brain]
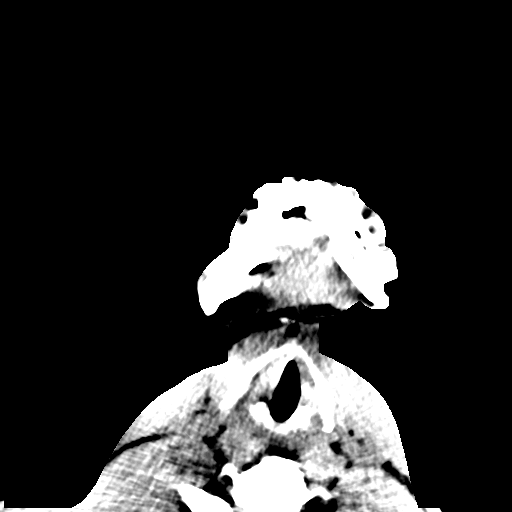
[im 29/85  brain]
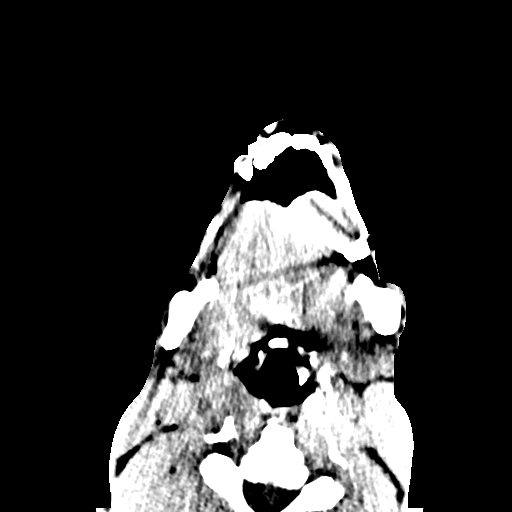
[im 43/85  brain]
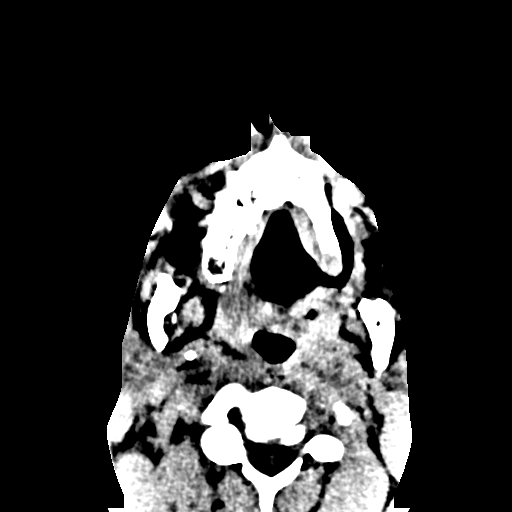
[im 57/85  brain]
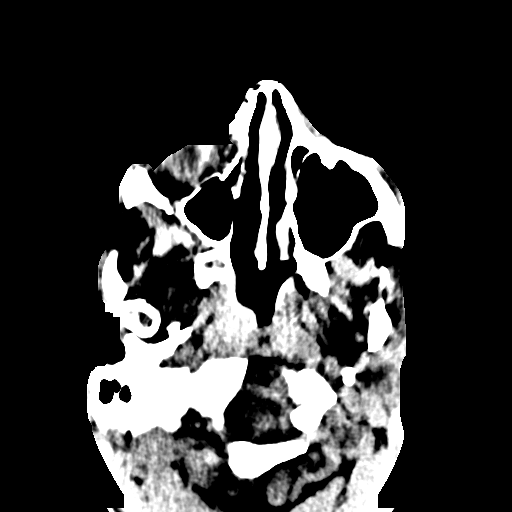
[im 71/85  brain]
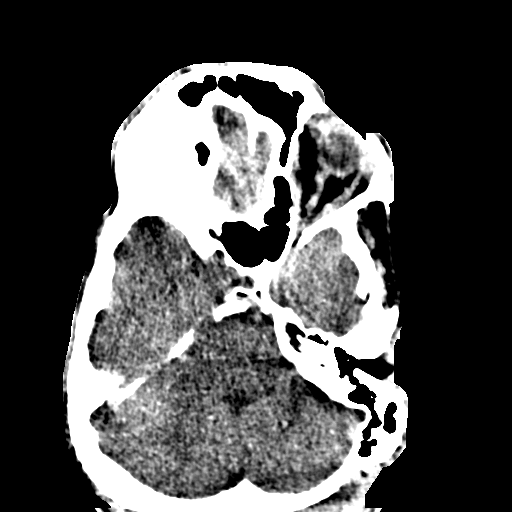

[Series 18: max soft · axial · 0.37mm/px · z∈[+317,+347]mm · 2 of 62 slices shown (2 of 2)]
[im 16/62  brain]
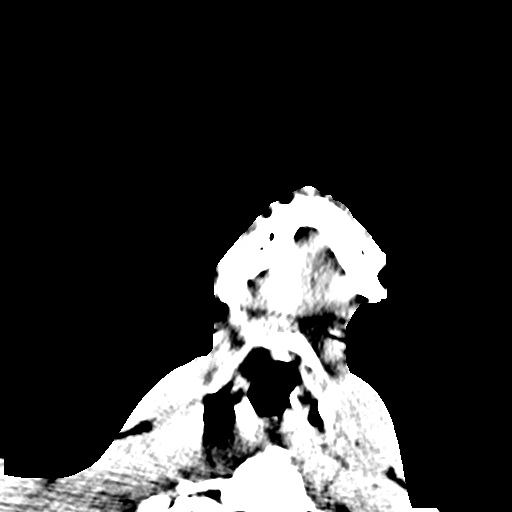
[im 31/62  brain]
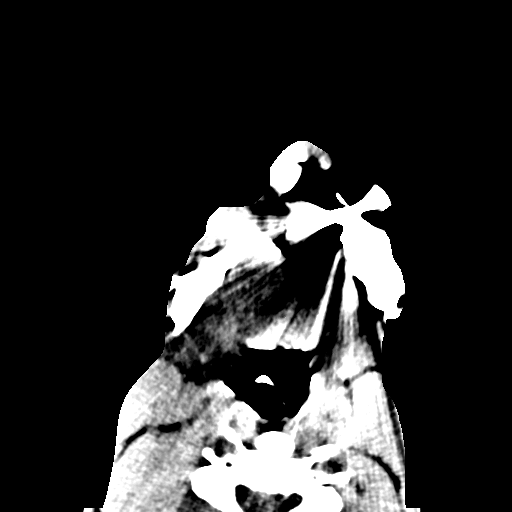

[17 of 47 positions shown; findings below may reference images not displayed]

FINDINGS: CT HEAD FINDINGS

Head motion does degrade exam.

Brain: No intracranial hemorrhage. No parenchymal contusion. No
midline shift or mass effect. Basilar cisterns are patent. No skull
base fracture. No fluid in the paranasal sinuses or mastoid air
cells. Orbits are normal.

Vascular: No hyperdense vessel or unexpected calcification.

Skull: Normal. Negative for fracture or focal lesion.

Sinuses/Orbits: Paranasal sinuses and mastoid air cells are clear.
Orbits are clear.

Other: None.

CT MAXILLOFACIAL FINDINGS

Head motion degrades exam

Osseous: Orbital walls are intact. Zygomatic arches intact. No fluid
in the paranasal sinuses. Nasal bones intact. Frontal sinuses
intact. Pterygoid plates are normal. Mandibular condyles are
located.

Orbits: Orbits are normal

Sinuses: Clear

Soft tissues: normal
IMPRESSION: Patient head motion does degrade exams.

No intracranial trauma.

No facial bone fracture.

## 2018-03-20 ENCOUNTER — Ambulatory Visit (INDEPENDENT_AMBULATORY_CARE_PROVIDER_SITE_OTHER): Payer: Medicaid Other | Admitting: Family Medicine

## 2018-03-20 ENCOUNTER — Encounter: Payer: Self-pay | Admitting: Family Medicine

## 2018-03-20 VITALS — BP 110/68 | HR 84 | Temp 98.2°F | Resp 18

## 2018-03-20 DIAGNOSIS — H1011 Acute atopic conjunctivitis, right eye: Secondary | ICD-10-CM | POA: Diagnosis not present

## 2018-03-20 DIAGNOSIS — J069 Acute upper respiratory infection, unspecified: Secondary | ICD-10-CM | POA: Diagnosis not present

## 2018-03-20 MED ORDER — BENZONATATE 100 MG PO CAPS: 100.0000 mg | ORAL_CAPSULE | Freq: Three times a day (TID) | ORAL | 0 refills | Status: DC | PRN

## 2018-03-20 MED ORDER — GUAIFENESIN ER 600 MG PO TB12: 600.0000 mg | ORAL_TABLET | Freq: Two times a day (BID) | ORAL | 0 refills | Status: DC | PRN

## 2018-03-20 MED ORDER — AZELASTINE HCL 0.05 % OP SOLN: 1.0000 [drp] | Freq: Two times a day (BID) | OPHTHALMIC | 3 refills | Status: DC

## 2018-03-20 NOTE — Progress Notes (Signed)
Name: Nicholas Bautista   MRN: [id number]    DOB: [birth year]   Date:03/20/2018       Progress Note  Subjective  Chief Complaint  Chief Complaint  Patient presents with  . Cough    runny nose  . Otalgia  . Conjunctivitis    HPI  PT presents with his mother who provides the history.  He has been feeling sick for about a day.  Endorses cough (worse than his baseline cough), bilateral ear pain, right eye itchiness, nasal congestion, and sore throat.  Denies fevers/chills, nausea, vomiting, diarrhea, chest pain, shortness of breath.  Patient Active Problem List   Diagnosis Date Noted  . Muscle spasm 03/18/2015  . Wheelchair bound 03/18/2015  . Inguinal hernia, right 03/18/2015  . Cerebral palsy (HCC) 03/16/2015  . Perennial allergic rhinitis 03/16/2015  . Chronic constipation 03/16/2015  . Insomnia, transient 03/16/2015  . Vitamin D deficiency 03/16/2015    Social History   Tobacco Use  . Smoking status: Never Smoker  . Smokeless tobacco: Never Used  Substance Use Topics  . Alcohol use: No    Alcohol/week: 0.0 standard drinks     Current Outpatient Medications:  .  diazepam (VALIUM) 5 MG tablet, Take 0.5-1 tablets (2.5-5 mg total) by mouth daily as needed for anxiety., Disp: 5 tablet, Rfl: 0 .  fluticasone (FLONASE) 50 MCG/ACT nasal spray, Place 2 sprays into both nostrils daily., Disp: 16 g, Rfl: 6 .  ibuprofen (ADVIL,MOTRIN) 600 MG tablet, Take 1 tablet (600 mg total) by mouth 3 (three) times daily as needed., Disp: 60 tablet, Rfl: 0 .  Lido-Capsaicin-Men-Methyl Sal 0.5-0.035-5-20 % PTCH, Apply 1 patch topically daily., Disp: 10 patch, Rfl: 1 .  loratadine (CLARITIN) 10 MG tablet, Take 1 tablet (10 mg total) by mouth daily., Disp: 30 tablet, Rfl: 11 .  Menthol, Topical Analgesic, (BIOFREEZE ROLL-ON) 4 % GEL, Apply 1 application topically 2 (two) times daily., Disp: 89 mL, Rfl: 1 .  MULTIPLE VITAMIN PO, Take 1 tablet by mouth daily., Disp: , Rfl:  .  olopatadine  (PATANOL) 0.1 % ophthalmic solution, Place 1 drop into both eyes 2 (two) times daily., Disp: 5 mL, Rfl: 5 .  polyethylene glycol powder (GLYCOLAX/MIRALAX) powder, MIX ONE CAPFUL (17G) IN 8 OUNCES OF FLUIDS AND DRINK BY MOUTH ONCE DAILY, Disp: 527 g, Rfl: 5 .  temazepam (RESTORIL) 30 MG capsule, Take 1 capsule (30 mg total) by mouth at bedtime as needed. for sleep, Disp: 30 capsule, Rfl: 5  Allergies  Allergen Reactions  . Levofloxacin     I personally reviewed active problem list, medication list, allergies with the patient/caregiver today.  ROS  Ten systems reviewed and is negative except as mentioned in HPI  Objective  Vitals:   03/20/18 0851  BP: 110/68  Pulse: 84  Resp: 18  Temp: 98.2 F (36.8 C)  TempSrc: Oral  SpO2: 96%   There is no height or weight on file to calculate BMI.  Nursing Note and Vital Signs reviewed.  Physical Exam  Constitutional: Patient appears well-developed and well-nourished. No distress.  HENT: Head: Normocephalic and atraumatic. Ears: bilateral TMs with no erythema or effusion; Nose: Nose normal. Mouth/Throat: Oropharynx is clear and moist. No oropharyngeal exudate or tonsillar swelling.  Eyes: Conjunctivae very slightly erythematous in the right, normal on the left. No scleral icterus.  Pupils are equal, round, and reactive to light.  Neck: Normal range of motion. Neck supple. No JVD present. No thyromegaly present.  Cardiovascular: Normal  rate, regular rhythm and normal heart sounds.  No murmur heard. No BLE edema. Pulmonary/Chest: Effort normal and breath sounds normal. No respiratory distress. Musculoskeletal: Normal range of motion, no joint effusions. No gross deformities Neurological: Pt is alert and at neurologic baseline today Skin: Skin is warm and dry. No rash noted. No erythema.  Psychiatric: Patient has a normal mood and affect. behavior is baseline.  No results found for this or any previous visit (from the past 72  hour(s)).  Assessment & Plan  1. Allergic conjunctivitis of right eye - azelastine (OPTIVAR) 0.05 % ophthalmic solution; Place 1 drop into both eyes 2 (two) times daily.  Dispense: 6 mL; Refill: 3  2. Viral upper respiratory infection - guaiFENesin (MUCINEX) 600 MG 12 hr tablet; Take 1 tablet (600 mg total) by mouth 2 (two) times daily as needed for cough or to loosen phlegm.  Dispense: 20 tablet; Refill: 0 - benzonatate (TESSALON PERLES) 100 MG capsule; Take 1 capsule (100 mg total) by mouth 3 (three) times daily as needed.  Dispense: 20 capsule; Refill: 0  -Red flags and when to present for emergency care or RTC including fever >101.52F, chest pain, shortness of breath, new/worsening/un-resolving symptoms, worsening eye redness/exudate reviewed with patient at time of visit. Follow up and care instructions discussed and provided in AVS.  Patient is wheelchair-bound, did not obtain weight today.

## 2018-04-29 ENCOUNTER — Telehealth: Payer: Self-pay | Admitting: Family Medicine

## 2018-04-29 NOTE — Telephone Encounter (Signed)
Preferred Medication:  Baclofen tablets, chlorzoxazone tablet, cyclobenzaprine tablet, methocarbamol tablet and tizanidine tablet.  Please change to preferred medication.

## 2018-04-29 NOTE — Telephone Encounter (Signed)
Copied from CRM [id number]. Topic: General - Other >> Apr 29, 2018 10:29 AM Gerrianne Scale wrote: Reason for CRM: Mother calling stating that Medicaid want pay for metaxalone (SKELAXIN) 800 MG tablet medicine and for the provider to state that the pt need to be on this medicine everyday due to his condition it needs a prior authorization

## 2018-04-29 NOTE — Telephone Encounter (Signed)
He has tried multiple medications, ask mother if she prefers rtizanidine or cyclobenzaprine.

## 2018-04-30 ENCOUNTER — Other Ambulatory Visit: Payer: Self-pay

## 2018-04-30 MED ORDER — METAXALONE 800 MG PO TABS: 800.0000 mg | ORAL_TABLET | Freq: Three times a day (TID) | ORAL | 2 refills | Status: DC

## 2018-04-30 NOTE — Telephone Encounter (Signed)
PA was approved for patient's Skelaxin mother notified and needs a refill sent over to [facility].

## 2018-04-30 NOTE — Telephone Encounter (Signed)
Patient has been approved for the Skelaxin 800mg  Approval Entry Complete  Confirmation #: [id number]  Prior Approval #: [id number]  Status: APPROVED   Thank you. Your request has been successfully submitted.

## 2018-04-30 NOTE — Telephone Encounter (Signed)
Mother states Tizanidine gave him side effects of severe lethargy and Cyclobenzaprine he was on for years and quit working-he would have to take multiple pills to control muscle spasms. Could you please help with this PA for Medicaid.

## 2018-05-02 ENCOUNTER — Encounter: Payer: Medicaid Other | Admitting: Family Medicine

## 2018-05-09 ENCOUNTER — Encounter: Payer: Self-pay | Admitting: Family Medicine

## 2018-05-09 ENCOUNTER — Ambulatory Visit (INDEPENDENT_AMBULATORY_CARE_PROVIDER_SITE_OTHER): Payer: Medicaid Other | Admitting: Family Medicine

## 2018-05-09 VITALS — BP 110/80 | HR 69 | Temp 98.1°F | Resp 18 | Ht 63.0 in | Wt 132.8 lb

## 2018-05-09 DIAGNOSIS — Z114 Encounter for screening for human immunodeficiency virus [HIV]: Secondary | ICD-10-CM | POA: Diagnosis not present

## 2018-05-09 DIAGNOSIS — Z131 Encounter for screening for diabetes mellitus: Secondary | ICD-10-CM

## 2018-05-09 DIAGNOSIS — Z13220 Encounter for screening for lipoid disorders: Secondary | ICD-10-CM

## 2018-05-09 DIAGNOSIS — Z111 Encounter for screening for respiratory tuberculosis: Secondary | ICD-10-CM

## 2018-05-09 DIAGNOSIS — Z23 Encounter for immunization: Secondary | ICD-10-CM | POA: Diagnosis not present

## 2018-05-09 DIAGNOSIS — Z1159 Encounter for screening for other viral diseases: Secondary | ICD-10-CM

## 2018-05-09 DIAGNOSIS — Z0000 Encounter for general adult medical examination without abnormal findings: Secondary | ICD-10-CM

## 2018-05-09 NOTE — Progress Notes (Signed)
Name: Nicholas Bautista   MRN: [id number]    DOB: 04/10/1986   Date:05/09/2018       Progress Note  Subjective  Chief Complaint  Chief Complaint  Patient presents with  . Annual Exam    HPI  Patient presents for annual CPE with his mother who assists with the history Fritzi Mandes).  USPSTF grade A and B recommendations:  Diet: Likes oatmeal, whatever is made for him; nothing too crunchy because he may choke.   Family does help with feeding except with finger foods.  Exercise: Does exercises at his day program daily; does have some arthritis and scoliosis.  Depression: Mood and behavior have been excellent - he is always happy and full of energy Depression screen [facility] 2/9 05/09/2018 03/20/2018 04/30/2017 03/27/2017 03/26/2016  Decreased Interest 0 - 0 0 0  Down, Depressed, Hopeless 0 0 - 0 0  PHQ - 2 Score 0 0 0 0 0  Altered sleeping 0 0 - - -  Tired, decreased energy 0 0 - - -  Change in appetite 0 0 - - -  Feeling bad or failure about yourself  0 0 - - -  Trouble concentrating 0 0 - - -  Moving slowly or fidgety/restless 0 0 - - -  Suicidal thoughts 0 0 - - -  PHQ-9 Score 0 - - - -  Difficult doing work/chores Not difficult at all Not difficult at all - - -   Hypertension:  BP Readings from Last 3 Encounters:  05/09/18 110/80  03/20/18 110/68  12/25/17 120/70   Obesity: Wt Readings from Last 3 Encounters:  05/09/18 132 lb 12.8 oz (60.2 kg)  04/30/17 104 lb (47.2 kg)  04/03/17 104 lb (47.2 kg)   BMI Readings from Last 3 Encounters:  05/09/18 23.52 kg/m  04/30/17 18.42 kg/m  04/03/17 18.42 kg/m    Lipids:  Lab Results  Component Value Date   CHOL 194 10/05/2014   Lab Results  Component Value Date   HDL 68 10/05/2014   Lab Results  Component Value Date   LDLCALC 110 10/05/2014   Lab Results  Component Value Date   TRIG 81 10/05/2014   No results found for: CHOLHDL No results found for: LDLDIRECT Glucose:  No results found for: GLUCOSE, GLUCAP     Office Visit from 05/09/2018 in [facility]  AUDIT-C Score  0     Single STD testing and prevention (HIV/chl/gon/syphilis): HIV today for routine screening; otherwise declines.  Hep C: We will order today.  Skin cancer: No concerns. Colorectal cancer: No family or personal history; no changes in BM's Prostate cancer: No personal or family history; no changes in urination habits. Lung cancer:  Low Dose CT Chest recommended if Age 43-80 years, 30 pack-year currently smoking OR have quit w/in 15years. Patient does not qualify.   AAA: Does not qualify. The USPSTF recommends one-time screening with ultrasonography in men ages 36 to 16 years who have ever smoked ECG:  None on file; no CP, shortness of breath, or palpitations. HR NL today  Advanced Care Planning: A voluntary discussion about advance care planning including the explanation and discussion of advance directives.  Discussed health care proxy and Living will, and the patient was able to identify a health care proxy as his mother. Patient does not have a living will at present time. If patient does have living will, I have requested they bring this to the clinic to be scanned in to their  chart.  Patient Active Problem List   Diagnosis Date Noted  . Muscle spasm 03/18/2015  . Wheelchair bound 03/18/2015  . Inguinal hernia, right 03/18/2015  . Cerebral palsy (HCC) 03/16/2015  . Perennial allergic rhinitis 03/16/2015  . Chronic constipation 03/16/2015  . Insomnia, transient 03/16/2015  . Vitamin D deficiency 03/16/2015    Past Surgical History:  Procedure Laterality Date  . ADENOIDECTOMY    . DENTAL SURGERY  07/30/2011  . TYMPANOSTOMY TUBE PLACEMENT      Family History  Problem Relation Age of Onset  . Obesity Mother   . Peptic Ulcer Father   . Diabetes Paternal Aunt   . CAD Maternal Aunt   . Diabetes Maternal Uncle   . Diabetes Maternal Grandmother   . CAD Paternal Grandmother     Social History    Socioeconomic History  . Marital status: Single    Spouse name: Not on file  . Number of children: Not on file  . Years of education: Not on file  . Highest education level: Not on file  Occupational History  . Occupation: disabled  Social Needs  . Financial resource strain: Not hard at all  . Food insecurity:    Worry: Never true    Inability: Never true  . Transportation needs:    Medical: No    Non-medical: No  Tobacco Use  . Smoking status: Never Smoker  . Smokeless tobacco: Never Used  Substance and Sexual Activity  . Alcohol use: No    Alcohol/week: 0.0 standard drinks  . Drug use: No  . Sexual activity: Never  Lifestyle  . Physical activity:    Days per week: 0 days    Minutes per session: 0 min  . Stress: Not at all  Relationships  . Social connections:    Talks on phone: Never    Gets together: Never    Attends religious service: More than 4 times per year    Active member of club or organization: Yes    Attends meetings of clubs or organizations: Never    Relationship status: Never married  . Intimate partner violence:    Fear of current or ex partner: No    Emotionally abused: No    Physically abused: No    Forced sexual activity: No  Other Topics Concern  . Not on file  Social History Narrative   Lives with mother, sister and niece     Current Outpatient Medications:  .  azelastine (OPTIVAR) 0.05 % ophthalmic solution, Place 1 drop into both eyes 2 (two) times daily., Disp: 6 mL, Rfl: 3 .  diazepam (VALIUM) 5 MG tablet, Take 0.5-1 tablets (2.5-5 mg total) by mouth daily as needed for anxiety., Disp: 5 tablet, Rfl: 0 .  fluticasone (FLONASE) 50 MCG/ACT nasal spray, Place 2 sprays into both nostrils daily., Disp: 16 g, Rfl: 6 .  ibuprofen (ADVIL,MOTRIN) 600 MG tablet, Take 1 tablet (600 mg total) by mouth 3 (three) times daily as needed., Disp: 60 tablet, Rfl: 0 .  Lido-Capsaicin-Men-Methyl Sal 0.5-0.035-5-20 % PTCH, Apply 1 patch topically daily.,  Disp: 10 patch, Rfl: 1 .  loratadine (CLARITIN) 10 MG tablet, Take 1 tablet (10 mg total) by mouth daily., Disp: 30 tablet, Rfl: 11 .  Menthol, Topical Analgesic, (BIOFREEZE ROLL-ON) 4 % GEL, Apply 1 application topically 2 (two) times daily., Disp: 89 mL, Rfl: 1 .  metaxalone (SKELAXIN) 800 MG tablet, Take 1 tablet (800 mg total) by mouth 3 (three) times daily., Disp: 90 tablet,  Rfl: 2 .  MULTIPLE VITAMIN PO, Take 1 tablet by mouth daily., Disp: , Rfl:  .  polyethylene glycol powder (GLYCOLAX/MIRALAX) powder, MIX ONE CAPFUL (17G) IN 8 OUNCES OF FLUIDS AND DRINK BY MOUTH ONCE DAILY, Disp: 527 g, Rfl: 5 .  temazepam (RESTORIL) 30 MG capsule, Take 1 capsule (30 mg total) by mouth at bedtime as needed. for sleep, Disp: 30 capsule, Rfl: 5 .  benzonatate (TESSALON PERLES) 100 MG capsule, Take 1 capsule (100 mg total) by mouth 3 (three) times daily as needed. (Patient not taking: Reported on 05/09/2018), Disp: 20 capsule, Rfl: 0 .  guaiFENesin (MUCINEX) 600 MG 12 hr tablet, Take 1 tablet (600 mg total) by mouth 2 (two) times daily as needed for cough or to loosen phlegm. (Patient not taking: Reported on 05/09/2018), Disp: 20 tablet, Rfl: 0  Allergies  Allergen Reactions  . Levofloxacin      ROS  Constitutional: Negative for fever or weight change.  Respiratory: Negative for cough and shortness of breath.   Cardiovascular: Negative for chest pain or palpitations.  Gastrointestinal: Negative for abdominal pain, no bowel changes.  Musculoskeletal: Negative for gait problem or joint swelling.  Skin: Negative for rash.  Neurological: Negative for dizziness or headache.  No other specific complaints in a complete review of systems (except as listed in HPI above).   Objective  Vitals:   05/09/18 0928  BP: 110/80  Pulse: 69  Resp: 18  Temp: 98.1 F (36.7 C)  TempSrc: Oral  SpO2: 96%  Weight: 132 lb 12.8 oz (60.2 kg)  Height: 5\' 3"  (1.6 m)   Body mass index is 23.52 kg/m.  Physical  Exam Constitutional: Patient appears well-developed and well-nourished. No distress.  HENT: Head: Normocephalic and atraumatic. Ears: B TMs ok, no erythema or effusion; Nose: Nose normal. Mouth/Throat: Oropharynx is clear and moist. No oropharyngeal exudate.  Eyes: Conjunctivae and EOM are normal. Pupils are equal, round, and reactive to light. No scleral icterus.  Neck: Normal range of motion. Neck supple. No JVD present. No thyromegaly present.  Cardiovascular: Normal rate, regular rhythm and normal heart sounds.  No murmur heard. No BLE edema. Pulmonary/Chest: Effort normal and breath sounds normal. No respiratory distress. Abdominal: Soft. Bowel sounds are normal, no distension. There is no tenderness. no masses MALE GENITALIA: Deferred RECTAL: Deferred Musculoskeletal: Some limited range of motion of all extremities - secondary to CP, no joint effusions.  Neurological: he is alert and oriented to person, place, and time. No cranial nerve deficit. Coordination, balance, strength, speech baseline for patient.  He is not able to ambulate  Skin: Skin is warm and dry. No rash noted. No erythema.  Psychiatric: Patient has an appropriate mood and affect. behavior is baseline. Judgment and thought content baseline.  No results found for this or any previous visit (from the past 2160 hour(s)).  PHQ2/9: Depression screen [facility] 2/9 05/09/2018 03/20/2018 04/30/2017 03/27/2017 03/26/2016  Decreased Interest 0 - 0 0 0  Down, Depressed, Hopeless 0 0 - 0 0  PHQ - 2 Score 0 0 0 0 0  Altered sleeping 0 0 - - -  Tired, decreased energy 0 0 - - -  Change in appetite 0 0 - - -  Feeling bad or failure about yourself  0 0 - - -  Trouble concentrating 0 0 - - -  Moving slowly or fidgety/restless 0 0 - - -  Suicidal thoughts 0 0 - - -  PHQ-9 Score 0 - - - -  Difficult doing work/chores Not difficult at all Not difficult at all - - -   Fall Risk: Fall Risk  05/09/2018 03/20/2018 04/30/2017 03/27/2017 03/26/2016   Falls in the past year? 0 No No No No  Injury with Fall? 0 - - - -  Risk for fall due to : - Impaired mobility - Impaired mobility -   Assessment & Plan  1. Annual physical exam -Prostate cancer screening and PSA options (with potential risks and benefits of testing vs not testing) were discussed along with recent recs/guidelines. -USPSTF grade A and B recommendations reviewed with patient; age-appropriate recommendations, preventive care, screening tests, etc discussed and encouraged; healthy living encouraged; see AVS for patient education given to patient -Discussed importance of 150 minutes of physical activity weekly, eat two servings of fish weekly, eat one serving of tree nuts ( cashews, pistachios, pecans, almonds.Marland Kitchen) every other day, eat 6 servings of fruit/vegetables daily and drink plenty of water and avoid sweet beverages. - Lipid panel - COMPLETE METABOLIC PANEL WITH GFR - HIV Antibody (routine testing w rflx) - Hepatitis C antibody - QuantiFERON-TB Gold Plus  2. Lipid screening - Lipid panel  3. Screening for HIV without presence of risk factors - HIV Antibody (routine testing w rflx)  4. Need for hepatitis C screening test - Hepatitis C antibody  5. Screening-pulmonary TB - QuantiFERON-TB Gold Plus  6. Diabetes mellitus screening - COMPLETE METABOLIC PANEL WITH GFR  7. Needs flu shot - Fluarix per orders.

## 2018-05-09 NOTE — Patient Instructions (Signed)
Preventive Care 18-39 Years, Male Preventive care refers to lifestyle choices and visits with your health care provider that can promote health and wellness. What does preventive care include?  A yearly physical exam. This is also called an annual well check.  Dental exams once or twice a year.  Routine eye exams. Ask your health care provider how often you should have your eyes checked.  Personal lifestyle choices, including: ? Daily care of your teeth and gums. ? Regular physical activity. ? Eating a healthy diet. ? Avoiding tobacco and drug use. ? Limiting alcohol use. ? Practicing safe sex. What happens during an annual well check? The services and screenings done by your health care provider during your annual well check will depend on your age, overall health, lifestyle risk factors, and family history of disease. Counseling Your health care provider may ask you questions about your:  Alcohol use.  Tobacco use.  Drug use.  Emotional well-being.  Home and relationship well-being.  Sexual activity.  Eating habits.  Work and work Statistician.  Screening You may have the following tests or measurements:  Height, weight, and BMI.  Blood pressure.  Lipid and cholesterol levels. These may be checked every 5 years starting at age 34.  Diabetes screening. This is done by checking your blood sugar (glucose) after you have not eaten for a while (fasting).  Skin check.  Hepatitis C blood test.  Hepatitis B blood test.  Sexually transmitted disease (STD) testing.  Discuss your test results, treatment options, and if necessary, the need for more tests with your health care provider. Vaccines Your health care provider may recommend certain vaccines, such as:  Influenza vaccine. This is recommended every year.  Tetanus, diphtheria, and acellular pertussis (Tdap, Td) vaccine. You may need a Td booster every 10 years.  Varicella vaccine. You may need this if you  have not been vaccinated.  HPV vaccine. If you are 23 or younger, you may need three doses over 6 months.  Measles, mumps, and rubella (MMR) vaccine. You may need at least one dose of MMR.You may also need a second dose.  Pneumococcal 13-valent conjugate (PCV13) vaccine. You may need this if you have certain conditions and have not been vaccinated.  Pneumococcal polysaccharide (PPSV23) vaccine. You may need one or two doses if you smoke cigarettes or if you have certain conditions.  Meningococcal vaccine. One dose is recommended if you are age 65-21 years and a first-year college student living in a residence hall, or if you have one of several medical conditions. You may also need additional booster doses.  Hepatitis A vaccine. You may need this if you have certain conditions or if you travel or work in places where you may be exposed to hepatitis A.  Hepatitis B vaccine. You may need this if you have certain conditions or if you travel or work in places where you may be exposed to hepatitis B.  Haemophilus influenzae type b (Hib) vaccine. You may need this if you have certain risk factors.  Talk to your health care provider about which screenings and vaccines you need and how often you need them. This information is not intended to replace advice given to you by your health care provider. Make sure you discuss any questions you have with your health care provider. Document Released: 08/14/2001 Document Revised: 03/07/2016 Document Reviewed: 04/19/2015 Elsevier Interactive Patient Education  [employer].

## 2018-08-14 ENCOUNTER — Ambulatory Visit (INDEPENDENT_AMBULATORY_CARE_PROVIDER_SITE_OTHER): Payer: Medicaid Other | Admitting: Family Medicine

## 2018-08-14 ENCOUNTER — Encounter: Payer: Self-pay | Admitting: Family Medicine

## 2018-08-14 VITALS — BP 122/74 | HR 80 | Temp 97.2°F | Resp 16 | Ht 63.0 in | Wt 133.0 lb

## 2018-08-14 DIAGNOSIS — M25512 Pain in left shoulder: Secondary | ICD-10-CM

## 2018-08-14 DIAGNOSIS — R569 Unspecified convulsions: Secondary | ICD-10-CM | POA: Diagnosis not present

## 2018-08-14 DIAGNOSIS — G802 Spastic hemiplegic cerebral palsy: Secondary | ICD-10-CM | POA: Diagnosis not present

## 2018-08-14 DIAGNOSIS — R269 Unspecified abnormalities of gait and mobility: Secondary | ICD-10-CM

## 2018-08-14 DIAGNOSIS — R451 Restlessness and agitation: Secondary | ICD-10-CM

## 2018-08-14 DIAGNOSIS — M25511 Pain in right shoulder: Secondary | ICD-10-CM

## 2018-08-14 DIAGNOSIS — G8929 Other chronic pain: Secondary | ICD-10-CM

## 2018-08-14 DIAGNOSIS — Z993 Dependence on wheelchair: Secondary | ICD-10-CM

## 2018-08-14 DIAGNOSIS — G4709 Other insomnia: Secondary | ICD-10-CM

## 2018-08-14 DIAGNOSIS — K5909 Other constipation: Secondary | ICD-10-CM

## 2018-08-14 DIAGNOSIS — M62838 Other muscle spasm: Secondary | ICD-10-CM | POA: Diagnosis not present

## 2018-08-14 MED ORDER — IBUPROFEN 600 MG PO TABS: 600.0000 mg | ORAL_TABLET | Freq: Three times a day (TID) | ORAL | 0 refills | Status: DC | PRN

## 2018-08-14 MED ORDER — TRAZODONE HCL 50 MG PO TABS: 25.0000 mg | ORAL_TABLET | Freq: Every evening | ORAL | 0 refills | Status: DC | PRN

## 2018-08-14 MED ORDER — DIAZEPAM 5 MG PO TABS: 2.5000 mg | ORAL_TABLET | Freq: Every day | ORAL | 0 refills | Status: DC | PRN

## 2018-08-14 MED ORDER — METAXALONE 800 MG PO TABS: 800.0000 mg | ORAL_TABLET | Freq: Three times a day (TID) | ORAL | 2 refills | Status: DC

## 2018-08-14 NOTE — Progress Notes (Signed)
Name: Nicholas Bautista   MRN: [id number]    DOB: February 19, 1986   Date:08/14/2018       Progress Note  Subjective  Chief Complaint  Chief Complaint  Patient presents with  . FL-2 Form  . Gait Problem    HPI  Cerebral Palsy: he has severe spasm and is wheelchair dependent. He is able to play with little bikes/turning the wheels, recently seen by PT who recommended a gait trainer to help with function , help with good posture and ambulation at daycare and at home. He is now on Skelaxin because flexeril stopped working and tizanidine made him very groggy. Marland Kitchen He has 24 hour care at home, from mother and sister, he goes to a day program 4 days a week. Mother can understand him, but is pretty much non-verbal for a person who does not live with him. He takes Valium prn for severe spasms or anxiety, no recent episodes of seizures. Sometimes he had pains that is diffent than regular spasms and takes Motrin prn, also has intermittent bilateral shoulder pain   AR: mother states he does not allow anything in his nose, mother gives it to him seldom, taking zyrtec not and sometimes claritin. Currently not sneezing, no rhinorrhea, seems to be doing well  Chronic Constipation: he takes Miralax every other day to be able to have regular bowel movements. No straining and no blood in stools.   Insomnia: Temazepam no longer working, we will change to Trazodone   Patient Active Problem List   Diagnosis Date Noted  . Muscle spasm 03/18/2015  . Wheelchair bound 03/18/2015  . Inguinal hernia, right 03/18/2015  . Cerebral palsy (HCC) 03/16/2015  . Perennial allergic rhinitis 03/16/2015  . Chronic constipation 03/16/2015  . Insomnia, transient 03/16/2015  . Vitamin D deficiency 03/16/2015    Past Surgical History:  Procedure Laterality Date  . ADENOIDECTOMY    . DENTAL SURGERY  07/30/2011  . TYMPANOSTOMY TUBE PLACEMENT      Family History  Problem Relation Age of Onset  . Obesity Mother   . Peptic  Ulcer Father   . Diabetes Paternal Aunt   . CAD Maternal Aunt   . Diabetes Maternal Uncle   . Diabetes Maternal Grandmother   . CAD Paternal Grandmother     Social History   Socioeconomic History  . Marital status: Single    Spouse name: Not on file  . Number of children: Not on file  . Years of education: Not on file  . Highest education level: Not on file  Occupational History  . Occupation: disabled  Social Needs  . Financial resource strain: Not hard at all  . Food insecurity:    Worry: Never true    Inability: Never true  . Transportation needs:    Medical: No    Non-medical: No  Tobacco Use  . Smoking status: Never Smoker  . Smokeless tobacco: Never Used  Substance and Sexual Activity  . Alcohol use: No    Alcohol/week: 0.0 standard drinks  . Drug use: No  . Sexual activity: Never  Lifestyle  . Physical activity:    Days per week: 0 days    Minutes per session: 0 min  . Stress: Not at all  Relationships  . Social connections:    Talks on phone: Never    Gets together: Never    Attends religious service: More than 4 times per year    Active member of club or organization: Yes  Attends meetings of clubs or organizations: Never    Relationship status: Never married  . Intimate partner violence:    Fear of current or ex partner: No    Emotionally abused: No    Physically abused: No    Forced sexual activity: No  Other Topics Concern  . Not on file  Social History Narrative   Lives with mother, sister and niece     Current Outpatient Medications:  .  azelastine (OPTIVAR) 0.05 % ophthalmic solution, Place 1 drop into both eyes 2 (two) times daily., Disp: 6 mL, Rfl: 3 .  Cholecalciferol 50 MCG (2000 UT) CAPS, Take by mouth., Disp: , Rfl:  .  diazepam (VALIUM) 5 MG tablet, Take 0.5-1 tablets (2.5-5 mg total) by mouth daily as needed for anxiety., Disp: 5 tablet, Rfl: 0 .  fluticasone (FLONASE) 50 MCG/ACT nasal spray, Place 2 sprays into both nostrils  daily., Disp: 16 g, Rfl: 6 .  ibuprofen (ADVIL,MOTRIN) 600 MG tablet, Take 1 tablet (600 mg total) by mouth 3 (three) times daily as needed., Disp: 60 tablet, Rfl: 0 .  Lido-Capsaicin-Men-Methyl Sal 0.5-0.035-5-20 % PTCH, Apply 1 patch topically daily., Disp: 10 patch, Rfl: 1 .  loratadine (CLARITIN) 10 MG tablet, Take 1 tablet (10 mg total) by mouth daily., Disp: 30 tablet, Rfl: 11 .  Menthol, Topical Analgesic, (BIOFREEZE ROLL-ON) 4 % GEL, Apply 1 application topically 2 (two) times daily., Disp: 89 mL, Rfl: 1 .  metaxalone (SKELAXIN) 800 MG tablet, Take 1 tablet (800 mg total) by mouth 3 (three) times daily., Disp: 90 tablet, Rfl: 2 .  Multiple Vitamins-Minerals (MULTIVITAMIN ADULT PO), Take by mouth., Disp: , Rfl:  .  polyethylene glycol powder (GLYCOLAX/MIRALAX) powder, MIX ONE CAPFUL (17G) IN 8 OUNCES OF FLUIDS AND DRINK BY MOUTH ONCE DAILY, Disp: 527 g, Rfl: 5 .  temazepam (RESTORIL) 30 MG capsule, Take 1 capsule (30 mg total) by mouth at bedtime as needed. for sleep, Disp: 30 capsule, Rfl: 5  Allergies  Allergen Reactions  . Levofloxacin   . Other     Seasonal allergies    I personally reviewed active problem list, medication list, allergies, family history, social history with the patient/caregiver today.   ROS  Constitutional: Negative for fever or weight change.  Respiratory: Negative for cough and shortness of breath.   Cardiovascular: Negative for chest pain or palpitations.  Gastrointestinal: Negative for abdominal pain, no bowel changes.  Musculoskeletal: Negative for gait problem or joint swelling.  Skin: Negative for rash.  Neurological: Negative for dizziness or headache.  No other specific complaints in a complete review of systems (except as listed in HPI above).  Objective  Vitals:   08/14/18 0952  BP: (!) 144/84  Pulse: 80  Resp: 16  Temp: (!) 97.2 F (36.2 C)  TempSrc: Oral  SpO2: 94%  Weight: 133 lb (60.3 kg)  Height: 5\' 3"  (1.6 m)    Body mass  index is 23.56 kg/m.  Physical Exam  Constitutional: Patient sitting on his wheelchair, excited, cooperative and seems happy. No distress.  HEENT: head atraumatic, normocephalic, pupils equal and reactive to light,  neck supple, throat within normal limits Skin: excoriation on scalp Muscular skeletal: atrophy legs, spastic movements of arms and legs unable to stretch legs , able to stretch arms.  Cardiovascular: Normal rate, regular rhythm and normal heart sounds.  No murmur heard. No BLE edema. Pulmonary/Chest: Effort normal and breath sounds normal. No respiratory distress. Abdominal: Soft.  There is no tenderness. Psychiatric: Patient  has a normal mood and affect. behavior is normal. Judgment and thought content normal.   PHQ2/9: [facility] 05/09/2018 03/20/2018 04/30/2017 03/27/2017 03/26/2016  Decreased Interest 0 - 0 0 0  Down, Depressed, Hopeless 0 0 - 0 0  PHQ - 2 Score 0 0 0 0 0  Altered sleeping 0 0 - - -  Tired, decreased energy 0 0 - - -  Change in appetite 0 0 - - -  Feeling bad or failure about yourself  0 0 - - -  Trouble concentrating 0 0 - - -  Moving slowly or fidgety/restless 0 0 - - -  Suicidal thoughts 0 0 - - -  PHQ-9 Score 0 - - - -  Difficult doing work/chores Not difficult at all Not difficult at all - - -     Fall Risk: Fall Risk  05/09/2018 03/20/2018 04/30/2017 03/27/2017 03/26/2016  Falls in the past year? 0 No No No No  Injury with Fall? 0 - - - -  Risk for fall due to : - Impaired mobility - Impaired mobility -     Functional Status Survey: Is the patient deaf or have difficulty hearing?: No Does the patient have difficulty seeing, even when wearing glasses/contacts?: No Does the patient have difficulty concentrating, remembering, or making decisions?: Yes Does the patient have difficulty walking or climbing stairs?: Yes Does the patient have difficulty dressing or bathing?: Yes Does the patient have difficulty doing errands alone  such as visiting a doctor's office or shopping?: Yes    Assessment & Plan  1. Spastic hemiplegic cerebral palsy (HCC)  - diazepam (VALIUM) 5 MG tablet; Take 0.5-1 tablets (2.5-5 mg total) by mouth daily as needed for anxiety.  Dispense: 30 tablet; Refill: 0  2. Muscle spasm  - diazepam (VALIUM) 5 MG tablet; Take 0.5-1 tablets (2.5-5 mg total) by mouth daily as needed for anxiety.  Dispense: 30 tablet; Refill: 0 - metaxalone (SKELAXIN) 800 MG tablet; Take 1 tablet (800 mg total) by mouth 3 (three) times daily.  Dispense: 90 tablet; Refill: 2 - ibuprofen (ADVIL,MOTRIN) 600 MG tablet; Take 1 tablet (600 mg total) by mouth 3 (three) times daily as needed.  Dispense: 90 tablet; Refill: 0  3. Chronic  pain both  shoulders  Intermittently, taking prn mediation  4. Seizure (HCC)  - diazepam (VALIUM) 5 MG tablet; Take 0.5-1 tablets (2.5-5 mg total) by mouth daily as needed for anxiety.  Dispense: 30 tablet; Refill: 0  5. Chronic constipation   6. Wheelchair bound   7. Gait abnormality  Had evaluation from PT and recommended a gait trainer to help with function at home and also day care  8. Other insomnia  Temazepam caused interrupted sleep, we will try trazodone  - traZODone (DESYREL) 50 MG tablet; Take 0.5-2 tablets (25-100 mg total) by mouth at bedtime as needed for sleep.  Dispense: 60 tablet; Refill: 0  9. Agitation  - diazepam (VALIUM) 5 MG tablet; Take 0.5-1 tablets (2.5-5 mg total) by mouth daily as needed for anxiety.  Dispense: 30 tablet; Refill: 0

## 2018-12-06 ENCOUNTER — Other Ambulatory Visit: Payer: Self-pay | Admitting: Family Medicine

## 2018-12-06 DIAGNOSIS — G4709 Other insomnia: Secondary | ICD-10-CM

## 2019-02-13 ENCOUNTER — Ambulatory Visit: Payer: Medicaid Other | Admitting: Family Medicine

## 2019-04-14 ENCOUNTER — Other Ambulatory Visit: Payer: Self-pay

## 2019-04-14 ENCOUNTER — Encounter: Payer: Self-pay | Admitting: Family Medicine

## 2019-04-14 ENCOUNTER — Ambulatory Visit (INDEPENDENT_AMBULATORY_CARE_PROVIDER_SITE_OTHER): Payer: Medicaid Other | Admitting: Family Medicine

## 2019-04-14 DIAGNOSIS — H1011 Acute atopic conjunctivitis, right eye: Secondary | ICD-10-CM

## 2019-04-14 DIAGNOSIS — Z23 Encounter for immunization: Secondary | ICD-10-CM

## 2019-04-14 DIAGNOSIS — M62838 Other muscle spasm: Secondary | ICD-10-CM

## 2019-04-14 DIAGNOSIS — G4709 Other insomnia: Secondary | ICD-10-CM

## 2019-04-14 DIAGNOSIS — R569 Unspecified convulsions: Secondary | ICD-10-CM | POA: Diagnosis not present

## 2019-04-14 DIAGNOSIS — R451 Restlessness and agitation: Secondary | ICD-10-CM

## 2019-04-14 DIAGNOSIS — G802 Spastic hemiplegic cerebral palsy: Secondary | ICD-10-CM | POA: Diagnosis not present

## 2019-04-14 MED ORDER — METAXALONE 800 MG PO TABS: 800.0000 mg | ORAL_TABLET | Freq: Three times a day (TID) | ORAL | 1 refills | Status: DC

## 2019-04-14 MED ORDER — TRAZODONE HCL 100 MG PO TABS: 100.0000 mg | ORAL_TABLET | Freq: Every day | ORAL | 1 refills | Status: DC

## 2019-04-14 MED ORDER — AZELASTINE HCL 0.05 % OP SOLN: 1.0000 [drp] | Freq: Two times a day (BID) | OPHTHALMIC | 1 refills | Status: DC

## 2019-04-14 NOTE — Progress Notes (Signed)
Name: Nicholas Bautista   MRN: [id number]    DOB: 1986-05-26   Date:04/14/2019       Progress Note  Subjective  Chief Complaint  Chief Complaint  Patient presents with  . Follow-up    check ears.    HPI  Cerebral Palsy: he has severe spasm and is wheelchair dependent. He is able to play with little bikes/turning the wheels, he also has a gait trainer.  He is now on Skelaxin because flexeril stopped working and tizanidine made him very groggy. He has 24 hour care at home, from mother and sister,day program was closed during covid -27 but just re-opened  . Mother can understand him, but is pretty much non-verbal for a person who does not live with him. He takes Valium prn for severe spasms or anxiety, no recent episodes of seizures. Sometimes he has pains that is diffent than regular spasms and takes Motrin prn he still has motrin at home. He is due for labs, but did not get valium today, we will check it next visit   AR: mother states he does not allow anything in his nose, mother gives it to him seldom, taking zyrtec daily now. Currently not sneezing, no rhinorrhea or nasal congestion. He  seems to be doing well  Chronic Constipation: he takes Miralax every other day to be able to have regular bowel movements. No straining and no blood in stools.  Unchanged   Insomnia: Temazepam was no longer working for him, and we switched to Trazodone back in Feb 220 and he is doing well on medication, he is on 2 pills at night and no side effects. He falls asleep within 30 minutes of taking medication   Seizure: no episodes in many years. Mother has valium at home, for seizures or agitation   Patient Active Problem List   Diagnosis Date Noted  . Seizure ([place]) 08/14/2018  . Muscle spasm 03/18/2015  . Wheelchair bound 03/18/2015  . Inguinal hernia, right 03/18/2015  . Cerebral palsy ([place]) 03/16/2015  . Perennial allergic rhinitis 03/16/2015  . Chronic constipation 03/16/2015  . Insomnia,  transient 03/16/2015  . Vitamin D deficiency 03/16/2015    Past Surgical History:  Procedure Laterality Date  . ADENOIDECTOMY    . DENTAL SURGERY  07/30/2011  . TYMPANOSTOMY TUBE PLACEMENT      Family History  Problem Relation Age of Onset  . Obesity Mother   . Diabetes Mother   . Peptic Ulcer Father   . Diabetes Paternal Aunt   . CAD Maternal Aunt   . Diabetes Maternal Uncle   . Diabetes Maternal Grandmother   . CAD Paternal Grandmother     Social History   Socioeconomic History  . Marital status: Single    Spouse name: Not on file  . Number of children: Not on file  . Years of education: Not on file  . Highest education level: Not on file  Occupational History  . Occupation: disabled  Social Needs  . Financial resource strain: Not hard at all  . Food insecurity    Worry: Never true    Inability: Never true  . Transportation needs    Medical: No    Non-medical: No  Tobacco Use  . Smoking status: Never Smoker  . Smokeless tobacco: Never Used  Substance and Sexual Activity  . Alcohol use: No    Alcohol/week: 0.0 standard drinks  . Drug use: No  . Sexual activity: Never  Lifestyle  . Physical activity  Days per week: 0 days    Minutes per session: 0 min  . Stress: Not at all  Relationships  . Social Musicianconnections    Talks on phone: Never    Gets together: Never    Attends religious service: More than 4 times per year    Active member of club or organization: Yes    Attends meetings of clubs or organizations: Never    Relationship status: Never married  . Intimate partner violence    Fear of current or ex partner: No    Emotionally abused: No    Physically abused: No    Forced sexual activity: No  Other Topics Concern  . Not on file  Social History Narrative   Lives with mother, sister and niece     Current Outpatient Medications:  .  azelastine (OPTIVAR) 0.05 % ophthalmic solution, Place 1 drop into both eyes 2 (two) times daily., Disp: 6 mL,  Rfl: 1 .  Cholecalciferol 50 MCG (2000 UT) CAPS, Take by mouth., Disp: , Rfl:  .  diazepam (VALIUM) 5 MG tablet, Take 0.5-1 tablets (2.5-5 mg total) by mouth daily as needed for anxiety., Disp: 30 tablet, Rfl: 0 .  fluticasone (FLONASE) 50 MCG/ACT nasal spray, Place 2 sprays into both nostrils daily., Disp: 16 g, Rfl: 6 .  ibuprofen (ADVIL,MOTRIN) 600 MG tablet, Take 1 tablet (600 mg total) by mouth 3 (three) times daily as needed., Disp: 90 tablet, Rfl: 0 .  loratadine (CLARITIN) 10 MG tablet, Take 1 tablet (10 mg total) by mouth daily., Disp: 30 tablet, Rfl: 11 .  metaxalone (SKELAXIN) 800 MG tablet, Take 1 tablet (800 mg total) by mouth 3 (three) times daily., Disp: 90 tablet, Rfl: 1 .  Multiple Vitamins-Minerals (MULTIVITAMIN ADULT PO), Take by mouth., Disp: , Rfl:  .  polyethylene glycol powder (GLYCOLAX/MIRALAX) powder, MIX ONE CAPFUL (17G) IN 8 OUNCES OF FLUIDS AND DRINK BY MOUTH ONCE DAILY, Disp: 527 g, Rfl: 5 .  traZODone (DESYREL) 100 MG tablet, Take 1 tablet (100 mg total) by mouth at bedtime., Disp: 90 tablet, Rfl: 1  Allergies  Allergen Reactions  . Levofloxacin   . Other     Seasonal allergies    I personally reviewed active problem list, medication list, allergies, family history, social history, health maintenance with the patient/caregiver today.   ROS  Ten systems reviewed and is negative except as mentioned in HPI - non verbal but does not seem to be in pain today   Objective  Vitals:   04/14/19 1012  BP: 120/80  Pulse: 77  Resp: 14  Temp: (!) 97.1 F (36.2 C)  TempSrc: Temporal  SpO2: 93%    There is no height or weight on file to calculate BMI.  Physical Exam  Constitutional: Patient appears well-developed and well-nourished.  No distress.  HEENT: head atraumatic, normocephalic, pupils equal and reactive to light, ears normal bilaterally , neck supple, oral exam not done  Cardiovascular: Normal rate, regular rhythm and normal heart sounds.  No murmur  heard. No BLE edema. Pulmonary/Chest: Effort normal and breath sounds normal. No respiratory distress. Abdominal: Soft.  There is no tenderness. Muscular skeletal: spasms and atrophy noticed upper and lower extremity  Psychiatric: Patient has a normal mood and affect. behavior is normal. Judgment and thought content normal.  PHQ2/9: Depression screen [facility] 2/9 04/14/2019 05/09/2018 03/20/2018 04/30/2017 03/27/2017  Decreased Interest 0 0 - 0 0  Down, Depressed, Hopeless 0 0 0 - 0  PHQ - 2 Score 0  0 0 0 0  Altered sleeping 0 0 0 - -  Tired, decreased energy 0 0 0 - -  Change in appetite 0 0 0 - -  Feeling bad or failure about yourself  0 0 0 - -  Trouble concentrating 0 0 0 - -  Moving slowly or fidgety/restless 0 0 0 - -  Suicidal thoughts 0 0 0 - -  PHQ-9 Score 0 0 - - -  Difficult doing work/chores Not difficult at all Not difficult at all Not difficult at all - -    phq 9 is negative   Fall Risk: Fall Risk  04/14/2019 05/09/2018 03/20/2018 04/30/2017 03/27/2017  Falls in the past year? 0 0 No No No  Number falls in past yr: 0 - - - -  Injury with Fall? 0 0 - - -  Risk for fall due to : - - Impaired mobility - Impaired mobility    Functional Status Survey: Is the patient deaf or have difficulty hearing?: No Does the patient have difficulty seeing, even when wearing glasses/contacts?: No Does the patient have difficulty concentrating, remembering, or making decisions?: No Does the patient have difficulty walking or climbing stairs?: Yes Does the patient have difficulty dressing or bathing?: Yes Does the patient have difficulty doing errands alone such as visiting a doctor's office or shopping?: Yes    Assessment & Plan   1. Spastic hemiplegic cerebral palsy (HCC)  - metaxalone (SKELAXIN) 800 MG tablet; Take 1 tablet (800 mg total) by mouth 3 (three) times daily.  Dispense: 90 tablet; Refill: 1  2. Need for immunization against influenza  - Flu Vaccine QUAD 36+ mos IM  3.  Other insomnia  - traZODone (DESYREL) 100 MG tablet; Take 1 tablet (100 mg total) by mouth at bedtime.  Dispense: 90 tablet; Refill: 1  4. Muscle spasm  - metaxalone (SKELAXIN) 800 MG tablet; Take 1 tablet (800 mg total) by mouth 3 (three) times daily.  Dispense: 90 tablet; Refill: 1  5. Seizure (HCC)  No episodes since childhood   6. Agitation  Occasional   7. Allergic conjunctivitis of right eye  - azelastine (OPTIVAR) 0.05 % ophthalmic solution; Place 1 drop into both eyes 2 (two) times daily.  Dispense: 6 mL; Refill: 1

## 2019-08-13 ENCOUNTER — Encounter: Payer: Self-pay | Admitting: Family Medicine

## 2019-08-13 ENCOUNTER — Other Ambulatory Visit: Payer: Self-pay

## 2019-08-13 ENCOUNTER — Ambulatory Visit (INDEPENDENT_AMBULATORY_CARE_PROVIDER_SITE_OTHER): Payer: Medicaid Other | Admitting: Family Medicine

## 2019-08-13 VITALS — BP 120/80 | HR 92 | Temp 96.0°F | Resp 16

## 2019-08-13 DIAGNOSIS — H579 Unspecified disorder of eye and adnexa: Secondary | ICD-10-CM

## 2019-08-13 NOTE — Progress Notes (Signed)
Name: Nicholas Bautista   MRN: [id number]    DOB: August 03, 1985   Date:08/13/2019       Progress Note  Subjective  Chief Complaint  Chief Complaint  Patient presents with  . Eye Problem    HPI  Right eye problem: sister noticed a white film over the right eye about 3 weeks ago, he does not seem to be bothered by it, no redness, itching or tearing from the eye.   Patient Active Problem List   Diagnosis Date Noted  . Seizure ([place]) 08/14/2018  . Muscle spasm 03/18/2015  . Wheelchair bound 03/18/2015  . Inguinal hernia, right 03/18/2015  . Cerebral palsy ([place]) 03/16/2015  . Perennial allergic rhinitis 03/16/2015  . Chronic constipation 03/16/2015  . Insomnia, transient 03/16/2015  . Vitamin D deficiency 03/16/2015    Past Surgical History:  Procedure Laterality Date  . ADENOIDECTOMY    . DENTAL SURGERY  07/30/2011  . TYMPANOSTOMY TUBE PLACEMENT      Family History  Problem Relation Age of Onset  . Obesity Mother   . Diabetes Mother   . Peptic Ulcer Father   . Diabetes Paternal Aunt   . CAD Maternal Aunt   . Diabetes Maternal Uncle   . Diabetes Maternal Grandmother   . CAD Paternal Grandmother     Social History   Tobacco Use  . Smoking status: Never Smoker  . Smokeless tobacco: Never Used  Substance Use Topics  . Alcohol use: No    Alcohol/week: 0.0 standard drinks  . Drug use: No     Current Outpatient Medications:  .  azelastine (OPTIVAR) 0.05 % ophthalmic solution, Place 1 drop into both eyes 2 (two) times daily., Disp: 6 mL, Rfl: 1 .  Cholecalciferol 50 MCG (2000 UT) CAPS, Take by mouth., Disp: , Rfl:  .  diazepam (VALIUM) 5 MG tablet, Take 0.5-1 tablets (2.5-5 mg total) by mouth daily as needed for anxiety., Disp: 30 tablet, Rfl: 0 .  fluticasone (FLONASE) 50 MCG/ACT nasal spray, Place 2 sprays into both nostrils daily., Disp: 16 g, Rfl: 6 .  ibuprofen (ADVIL,MOTRIN) 600 MG tablet, Take 1 tablet (600 mg total) by mouth 3 (three) times daily as needed.,  Disp: 90 tablet, Rfl: 0 .  loratadine (CLARITIN) 10 MG tablet, Take 1 tablet (10 mg total) by mouth daily., Disp: 30 tablet, Rfl: 11 .  metaxalone (SKELAXIN) 800 MG tablet, Take 1 tablet (800 mg total) by mouth 3 (three) times daily., Disp: 90 tablet, Rfl: 1 .  Multiple Vitamins-Minerals (MULTIVITAMIN ADULT PO), Take by mouth., Disp: , Rfl:  .  polyethylene glycol powder (GLYCOLAX/MIRALAX) powder, MIX ONE CAPFUL (17G) IN 8 OUNCES OF FLUIDS AND DRINK BY MOUTH ONCE DAILY, Disp: 527 g, Rfl: 5 .  traZODone (DESYREL) 100 MG tablet, Take 1 tablet (100 mg total) by mouth at bedtime., Disp: 90 tablet, Rfl: 1  Allergies  Allergen Reactions  . Levofloxacin   . Other     Seasonal allergies    I personally reviewed active problem list, medication list, allergies, family history, social history, health maintenance with the patient/caregiver today.   ROS  Ten systems reviewed and is negative except as mentioned in HPI   Objective  Vitals:   08/13/19 0840  BP: 120/80  Pulse: 92  Resp: 16  Temp: (!) 96 F (35.6 C)  TempSrc: Temporal  SpO2: 99%    There is no height or weight on file to calculate BMI.  Physical Exam  Constitutional: Patient appears happy,  he is very calm, not having as many spasms as usual  HEENT: head atraumatic, normocephalic, pupils equal and reactive to light, he has round possible ulceration on right cornea, very regular borders normal TM Cardiovascular: Normal rate, regular rhythm and normal heart sounds.  No murmur heard. No BLE edema. Pulmonary/Chest: Effort normal and breath sounds normal. No respiratory distress. Abdominal: Soft.  There is no tenderness. Muscular skeletal: spastic movements, sitting in a wheelchair  Psychiatric: Patient has a normal mood and affect. behavior is normal. Judgment and thought content normal.   PHQ2/9: Depression screen [facility] 2/9 04/14/2019 05/09/2018 03/20/2018 04/30/2017 03/27/2017  Decreased Interest 0 0 - 0 0  Down, Depressed,  Hopeless 0 0 0 - 0  PHQ - 2 Score 0 0 0 0 0  Altered sleeping 0 0 0 - -  Tired, decreased energy 0 0 0 - -  Change in appetite 0 0 0 - -  Feeling bad or failure about yourself  0 0 0 - -  Trouble concentrating 0 0 0 - -  Moving slowly or fidgety/restless 0 0 0 - -  Suicidal thoughts 0 0 0 - -  PHQ-9 Score 0 0 - - -  Difficult doing work/chores Not difficult at all Not difficult at all Not difficult at all - -    phq 9 is negative  Fall Risk: Fall Risk  08/13/2019 04/14/2019 05/09/2018 03/20/2018 04/30/2017  Falls in the past year? 0 0 0 No No  Number falls in past yr: 0 0 - - -  Injury with Fall? 0 0 0 - -  Risk for fall due to : - - - Impaired mobility -     Functional Status Survey: Is the patient deaf or have difficulty hearing?: No Does the patient have difficulty seeing, even when wearing glasses/contacts?: No Does the patient have difficulty concentrating, remembering, or making decisions?: Yes Does the patient have difficulty walking or climbing stairs?: Yes Does the patient have difficulty dressing or bathing?: Yes Does the patient have difficulty doing errands alone such as visiting a doctor's office or shopping?: Yes    Assessment & Plan  1. Eye problem  - Ambulatory referral to Ophthalmology

## 2019-10-13 ENCOUNTER — Encounter: Payer: Self-pay | Admitting: Family Medicine

## 2019-10-13 ENCOUNTER — Other Ambulatory Visit: Payer: Self-pay

## 2019-10-13 ENCOUNTER — Ambulatory Visit (INDEPENDENT_AMBULATORY_CARE_PROVIDER_SITE_OTHER): Payer: Medicaid Other | Admitting: Family Medicine

## 2019-10-13 VITALS — BP 130/72 | HR 91 | Temp 97.3°F | Resp 16

## 2019-10-13 DIAGNOSIS — H18601 Keratoconus, unspecified, right eye: Secondary | ICD-10-CM | POA: Diagnosis not present

## 2019-10-13 DIAGNOSIS — Z79899 Other long term (current) drug therapy: Secondary | ICD-10-CM | POA: Diagnosis not present

## 2019-10-13 DIAGNOSIS — G4709 Other insomnia: Secondary | ICD-10-CM

## 2019-10-13 DIAGNOSIS — Z131 Encounter for screening for diabetes mellitus: Secondary | ICD-10-CM

## 2019-10-13 DIAGNOSIS — R569 Unspecified convulsions: Secondary | ICD-10-CM

## 2019-10-13 DIAGNOSIS — M62838 Other muscle spasm: Secondary | ICD-10-CM

## 2019-10-13 DIAGNOSIS — G802 Spastic hemiplegic cerebral palsy: Secondary | ICD-10-CM | POA: Diagnosis not present

## 2019-10-13 DIAGNOSIS — H1011 Acute atopic conjunctivitis, right eye: Secondary | ICD-10-CM

## 2019-10-13 DIAGNOSIS — R451 Restlessness and agitation: Secondary | ICD-10-CM

## 2019-10-13 MED ORDER — DIAZEPAM 5 MG PO TABS: 2.5000 mg | ORAL_TABLET | Freq: Every day | ORAL | 0 refills | Status: DC | PRN

## 2019-10-13 MED ORDER — TRAZODONE HCL 100 MG PO TABS: 100.0000 mg | ORAL_TABLET | Freq: Every day | ORAL | 1 refills | Status: DC

## 2019-10-13 MED ORDER — AZELASTINE HCL 0.05 % OP SOLN: 1.0000 [drp] | Freq: Two times a day (BID) | OPHTHALMIC | 1 refills | Status: DC

## 2019-10-13 MED ORDER — METAXALONE 800 MG PO TABS: 800.0000 mg | ORAL_TABLET | Freq: Three times a day (TID) | ORAL | 1 refills | Status: DC

## 2019-10-13 MED ORDER — IBUPROFEN 600 MG PO TABS: 600.0000 mg | ORAL_TABLET | Freq: Three times a day (TID) | ORAL | 0 refills | Status: DC | PRN

## 2019-10-13 NOTE — Progress Notes (Signed)
Name: Nicholas Bautista   MRN: [id number]    DOB: 09/28/1985   Date:10/13/2019       Progress Note  Subjective  Chief Complaint  Chief Complaint  Patient presents with  . cerebal palsy  . Insomnia  . Seizures  . Medication Refill    Needs refill on Zanax.   He came in today with sister : Denita  HPI  Cerebral Palsy: he has severe spasm and is wheelchair dependent. He is able to use his  gait trainer.  He is now on Skelaxin because flexeril stopped working and tizanidine made him very groggy.He has 24 hour care at home, from mother and sister,day program was closed during covid -19 but just re-opened and mother is not comfortable taking him back yet.  Mother can understand him, but is pretty much non-verbal for a person who does not live with him. He takes Valium prn for severe spasms or anxiety, no recent episodes of seizures. Sometimes he has pains that is diffent than regular spasms and takes Motrin prn he still has motrin at home.   AR: mother states he does not allow anything in his nose, mother gives it to him seldom, taking zyrtec daily now. Currently not sneezing, no rhinorrhea or nasal congestion. Symptoms are controlled   Chronic Constipation: he takes Miralax every other day to be able to have regular bowel movements. No straining and, blood in stools or abdominal pain, doing wel.  Insomnia:Temazepam was no longer working for him, and we switched to Trazodone back in Feb 20 and he is doing well on medication, he is on 100 mg qhs. He falls asleep within 30 minutes of taking medication, he seems to have a restful night, very seldom he wakes up during the night . It does not cause him to get sedated during the day   Seizure: no episodes in many years. He is due for refill of Valium, he uses prn prior to procedures, when spasms are severe or prn seizure, he has not had any in years.   Keratoconus: developed a change on eye back in Feb, he was seen by ophthalmologist at [facility], they are trying to find out what the next steps are since he is not able to have contacts on.    Patient Active Problem List   Diagnosis Date Noted  . 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 Seizure ([facility]) 08/14/2018  . Muscle spasm 03/18/2015  . Wheelchair bound 03/18/2015  . Inguinal hernia, right 03/18/2015  . Cerebral palsy (H[facility]) 03/16/2015  . Perennial allergic rhinitis 03/16/2015  . Chronic constipation 03/16/2015  . Insomnia, transient 03/16/2015  . Vitamin D deficiency 03/16/2015    Past Surgical History:  Procedure Laterality Date  . ADENOIDECTOMY    . DENTAL SURGERY  07/30/2011  . TYMPANOSTOMY TUBE PLACEMENT      Family History  Problem Relation Age of Onset  . Obesity Mother   . Diabetes Mother   . Peptic Ulcer Father   . Diabetes Paternal Aunt   . CAD Maternal Aunt   . Diabetes Maternal Uncle   . Diabetes Maternal Grandmother   . CAD Paternal Grandmother     Social History   Tobacco Use  . Smoking status: Never Smoker  . Smokeless tobacco: Never Used  Substance Use Topics  . Alcohol use: No    Alcohol/week: 0.0 standard drinks     Current Outpatient Medications:  .  azelastine (OPTIVAR) 0.05 % ophthalmic solution, Place 1  drop into both eyes 2 (two) times daily., Disp: 6 mL, Rfl: 1 .  Cholecalciferol 50 MCG (2000 UT) CAPS, Take by mouth., Disp: , Rfl:  .  diazepam (VALIUM) 5 MG tablet, Take 0.5-1 tablets (2.5-5 mg total) by mouth daily as needed for anxiety., Disp: 30 tablet, Rfl: 0 .  fluticasone (FLONASE) 50 MCG/ACT nasal spray, Place 2 sprays into both nostrils daily., Disp: 16 g, Rfl: 6 .  ibuprofen (ADVIL,MOTRIN) 600 MG tablet, Take 1 tablet (600 mg total) by mouth 3 (three) times daily as needed., Disp: 90 tablet, Rfl: 0 .  loratadine (CLARITIN) 10 MG tablet, Take 1 tablet (10 mg total) by mouth daily., Disp: 30 tablet, Rfl: 11 .  metaxalone (SKELAXIN) 800 MG tablet, Take 1 tablet (800 mg total) by mouth 3 (three) times daily., Disp:  90 tablet, Rfl: 1 .  Multiple Vitamins-Minerals (MULTIVITAMIN ADULT PO), Take by mouth., Disp: , Rfl:  .  polyethylene glycol powder (GLYCOLAX/MIRALAX) powder, MIX ONE CAPFUL (17G) IN 8 OUNCES OF FLUIDS AND DRINK BY MOUTH ONCE DAILY, Disp: 527 g, Rfl: 5 .  traZODone (DESYREL) 100 MG tablet, Take 1 tablet (100 mg total) by mouth at bedtime., Disp: 90 tablet, Rfl: 1  Allergies  Allergen Reactions  . Levofloxacin   . Other     Seasonal allergies    I personally reviewed active problem list, medication list, allergies, family history, social history, health maintenance with the patient/caregiver today.   ROS  Ten systems reviewed and is negative except as mentioned in HPI Given per sister   Objective  Vitals:   10/13/19 1010  BP: 130/72  Pulse: 91  Resp: 16  Temp: (!) 97.3 F (36.3 C)  TempSrc: Temporal  SpO2: 100%    There is no height or weight on file to calculate BMI.  Physical Exam  Constitutional: Patient appears well-developed and  Very think, spasms present No distress.  HEENT: head atraumatic, normocephalic, pupils equal and reactive to light, ears normal TM, neck supple, throat within normal limits Cardiovascular: Normal rate, regular rhythm and normal heart sounds.  No murmur heard. No BLE edema. Pulmonary/Chest: Effort normal and breath sounds normal. No respiratory distress. Abdominal: Soft.  There is no tenderness. Muscular skeletal: spasms, and wheelchair bound Psychiatric: Patient has a normal mood and affect. behavior is normal. Judgment and thought content normal.  PHQ2/9: Depression screen [facility] 2/9 10/13/2019 04/14/2019 05/09/2018 03/20/2018 04/30/2017  Decreased Interest 0 0 0 - 0  Down, Depressed, Hopeless 0 0 0 0 -  PHQ - 2 Score 0 0 0 0 0  Altered sleeping 0 0 0 0 -  Tired, decreased energy 0 0 0 0 -  Change in appetite 0 0 0 0 -  Feeling bad or failure about yourself  0 0 0 0 -  Trouble concentrating 0 0 0 0 -  Moving slowly or fidgety/restless 0  0 0 0 -  Suicidal thoughts 0 0 0 0 -  PHQ-9 Score 0 0 0 - -  Difficult doing work/chores - Not difficult at all Not difficult at all Not difficult at all -    phq 9 is negative   Fall Risk: Fall Risk  10/13/2019 08/13/2019 04/14/2019 05/09/2018 03/20/2018  Falls in the past year? 0 0 0 0 No  Number falls in past yr: 0 0 0 - -  Injury with Fall? 0 0 0 0 -  Risk for fall due to : - - - - Impaired mobility     Functional  Status Survey: Is the patient deaf or have difficulty hearing?: No Does the patient have difficulty seeing, even when wearing glasses/contacts?: No Does the patient have difficulty concentrating, remembering, or making decisions?: Yes Does the patient have difficulty walking or climbing stairs?: Yes Does the patient have difficulty dressing or bathing?: Yes Does the patient have difficulty doing errands alone such as visiting a doctor's office or shopping?: Yes    Assessment & Plan  1. Spastic hemiplegic cerebral palsy (HCC)  - diazepam (VALIUM) 5 MG tablet; Take 0.5-1 tablets (2.5-5 mg total) by mouth daily as needed for anxiety.  Dispense: 30 tablet; Refill: 0 - metaxalone (SKELAXIN) 800 MG tablet; Take 1 tablet (800 mg total) by mouth 3 (three) times daily.  Dispense: 90 tablet; Refill: 1  2. Seizure (HCC)  - diazepam (VALIUM) 5 MG tablet; Take 0.5-1 tablets (2.5-5 mg total) by mouth daily as needed for anxiety.  Dispense: 30 tablet; Refill: 0  3. Keratoconus of right eye   4. Spastic hemiplegic cerebral palsy (HCC)  - diazepam (VALIUM) 5 MG tablet; Take 0.5-1 tablets (2.5-5 mg total) by mouth daily as needed for anxiety.  Dispense: 30 tablet; Refill: 0 - metaxalone (SKELAXIN) 800 MG tablet; Take 1 tablet (800 mg total) by mouth 3 (three) times daily.  Dispense: 90 tablet; Refill: 1  5. Muscle spasm  - diazepam (VALIUM) 5 MG tablet; Take 0.5-1 tablets (2.5-5 mg total) by mouth daily as needed for anxiety.  Dispense: 30 tablet; Refill: 0 - metaxalone  (SKELAXIN) 800 MG tablet; Take 1 tablet (800 mg total) by mouth 3 (three) times daily.  Dispense: 90 tablet; Refill: 1 - ibuprofen (ADVIL) 600 MG tablet; Take 1 tablet (600 mg total) by mouth 3 (three) times daily as needed.  Dispense: 90 tablet; Refill: 0  6. Agitation  - diazepam (VALIUM) 5 MG tablet; Take 0.5-1 tablets (2.5-5 mg total) by mouth daily as needed for anxiety.  Dispense: 30 tablet; Refill: 0  7. Other insomnia  - traZODone (DESYREL) 100 MG tablet; Take 1 tablet (100 mg total) by mouth at bedtime.  Dispense: 90 tablet; Refill: 1  8. Allergic conjunctivitis of right eye  - azelastine (OPTIVAR) 0.05 % ophthalmic solution; Place 1 drop into both eyes 2 (two) times daily.  Dispense: 6 mL; Refill: 1  9. Long-term use of high-risk medication  - COMPLETE METABOLIC PANEL WITH GFR - CBC with Differential/Platelet  10. Screening for diabetes mellitus  - Hemoglobin A1c

## 2019-10-14 LAB — CBC WITH DIFFERENTIAL/PLATELET
Absolute Monocytes: 510 cells/uL (ref 200–950)
Basophils Absolute: 10 cells/uL (ref 0–200)
Basophils Relative: 0.2 %
Eosinophils Absolute: 120 cells/uL (ref 15–500)
Eosinophils Relative: 2.3 %
HCT: 47 % (ref 38.5–50.0)
Hemoglobin: 16.2 g/dL (ref 13.2–17.1)
Lymphs Abs: 1602 cells/uL (ref 850–3900)
MCH: 31.8 pg (ref 27.0–33.0)
MCHC: 34.5 g/dL (ref 32.0–36.0)
MCV: 92.2 fL (ref 80.0–100.0)
MPV: 9.4 fL (ref 7.5–12.5)
Monocytes Relative: 9.8 %
Neutro Abs: 2959 cells/uL (ref 1500–7800)
Neutrophils Relative %: 56.9 %
Platelets: 249 10*3/uL (ref 140–400)
RBC: 5.1 10*6/uL (ref 4.20–5.80)
RDW: 11.8 % (ref 11.0–15.0)
Total Lymphocyte: 30.8 %
WBC: 5.2 10*3/uL (ref 3.8–10.8)

## 2019-10-14 LAB — COMPLETE METABOLIC PANEL WITH GFR
AG Ratio: 1.6 (calc) (ref 1.0–2.5)
ALT: 16 U/L (ref 9–46)
AST: 21 U/L (ref 10–40)
Albumin: 4.1 g/dL (ref 3.6–5.1)
Alkaline phosphatase (APISO): 73 U/L (ref 36–130)
BUN: 13 mg/dL (ref 7–25)
CO2: 26 mmol/L (ref 20–32)
Calcium: 9.7 mg/dL (ref 8.6–10.3)
Chloride: 104 mmol/L (ref 98–110)
Creat: 0.88 mg/dL (ref 0.60–1.35)
GFR, Est African American: 130 mL/min/{1.73_m2} (ref >=60)
GFR, Est Non African American: 112 mL/min/{1.73_m2} (ref >=60)
Globulin: 2.6 g/dL (calc) (ref 1.9–3.7)
Glucose, Bld: 115 mg/dL — ABNORMAL HIGH (ref 65–99)
Potassium: 4 mmol/L (ref 3.5–5.3)
Sodium: 140 mmol/L (ref 135–146)
Total Bilirubin: 0.7 mg/dL (ref 0.2–1.2)
Total Protein: 6.7 g/dL (ref 6.1–8.1)

## 2019-10-14 LAB — HEMOGLOBIN A1C
Hgb A1c MFr Bld: 5 % of total Hgb (ref <5.7)
Mean Plasma Glucose: 97 (calc)
eAG (mmol/L): 5.4 (calc)

## 2019-10-17 ENCOUNTER — Ambulatory Visit: Payer: Medicaid Other | Attending: Internal Medicine

## 2019-10-17 DIAGNOSIS — Z23 Encounter for immunization: Secondary | ICD-10-CM

## 2019-10-17 NOTE — Progress Notes (Signed)
   Covid-19 Vaccination Clinic  Name:  RASHAAN WYLES    MRN: [id number] DOB: 03/17/1986  10/17/2019  Mr. Sevey was observed post Covid-19 immunization for 15 minutes without incident. He was provided with Vaccine Information Sheet and instruction to access the V-Safe system.   Mr. Butt was instructed to call 911 with any severe reactions post vaccine: Marland Kitchen Difficulty breathing  . Swelling of face and throat  . A fast heartbeat  . A bad rash all over body  . Dizziness and weakness   Immunizations Administered    Name Date Dose VIS Date Route   Pfizer COVID-19 Vaccine 10/17/2019  1:44 PM 0.3 mL 06/12/2019 Intramuscular   Manufacturer: [employer], [employer]   Lot: [id number]   NDC: [address]-6

## 2019-11-10 ENCOUNTER — Ambulatory Visit: Payer: Medicaid Other | Attending: Internal Medicine

## 2019-11-10 DIAGNOSIS — Z23 Encounter for immunization: Secondary | ICD-10-CM

## 2019-11-10 NOTE — Progress Notes (Signed)
   Covid-19 Vaccination Clinic  Name:  Nicholas Bautista    MRN: [id number] DOB: 01/14/1986  11/10/2019  [name] was observed post Covid-19 immunization for 15 minutes without incident. He was provided with Vaccine Information Sheet and instruction to access the V-Safe system.   [name] was instructed to call 911 with any severe reactions post vaccine: Marland Kitchen Difficulty breathing  . Swelling of face and throat  . A fast heartbeat  . A bad rash all over body  . Dizziness and weakness   Immunizations Administered    Name Date Dose VIS Date Route   Pfizer COVID-19 Vaccine 11/10/2019  9:23 AM 0.3 mL 08/26/2018 Intramuscular   Manufacturer: [employer], [employer]   Lot: [id number]   NDC: [address]-6

## 2020-02-03 ENCOUNTER — Ambulatory Visit (INDEPENDENT_AMBULATORY_CARE_PROVIDER_SITE_OTHER): Payer: Medicaid Other | Admitting: Internal Medicine

## 2020-02-03 DIAGNOSIS — R451 Restlessness and agitation: Secondary | ICD-10-CM

## 2020-02-03 DIAGNOSIS — G802 Spastic hemiplegic cerebral palsy: Secondary | ICD-10-CM | POA: Diagnosis not present

## 2020-02-03 DIAGNOSIS — M62838 Other muscle spasm: Secondary | ICD-10-CM

## 2020-02-03 DIAGNOSIS — H6692 Otitis media, unspecified, left ear: Secondary | ICD-10-CM

## 2020-02-03 DIAGNOSIS — R569 Unspecified convulsions: Secondary | ICD-10-CM

## 2020-02-03 DIAGNOSIS — B370 Candidal stomatitis: Secondary | ICD-10-CM | POA: Diagnosis not present

## 2020-02-03 MED ORDER — DIAZEPAM 5 MG PO TABS: 2.5000 mg | ORAL_TABLET | Freq: Every day | ORAL | 0 refills | Status: DC | PRN

## 2020-02-03 MED ORDER — FLUCONAZOLE 100 MG PO TABS: 100.0000 mg | ORAL_TABLET | Freq: Every day | ORAL | 1 refills | Status: AC

## 2020-02-03 MED ORDER — AMOXICILLIN-POT CLAVULANATE 875-125 MG PO TABS: 1.0000 | ORAL_TABLET | Freq: Two times a day (BID) | ORAL | 0 refills | Status: DC

## 2020-02-03 NOTE — Progress Notes (Signed)
Name: Nicholas Bautista   MRN: [id number]    DOB: [birth year]   Date:02/03/2020       Progress Note  Subjective  Chief Complaint  Chief Complaint  Patient presents with  . Ear Pain    left ear pain started Sunday night, has been off and on for about a week  . Thrush    Patient's mom says he has thick white coating on tongue    I connected with  Randolm Idol III on 02/03/20 at  9:40 AM EDT by telephone and verified that I am speaking with the correct person using two identifiers.  I discussed the limitations, risks, security and privacy concerns of performing an evaluation and management service by telephone and the availability of in person appointments. The patient expressed understanding and agreed to proceed. Staff also discussed with the patient that there may be a patient responsible charge related to this service. Patient Location: Home Provider Location: [facility] Additional Individuals present: none  HPI  Patient is a 34 year old male patient of Dr. Carlynn Purl Last visit with her was April 2021 with that note reviewed. Patient has cerebral palsy, is wheelchair dependent. He has 24-hour care at home with mother and sister helping He was noted to have a seizure history, although has not had episodes in many years, and notes that Valium is used as needed prior to procedures and when spasms are severe or was as needed for seizures.  The mom requested a refill of that medicine today.  Follows up by phone call today with concerns for an ear infection as well as thrush again on his tongue.  Mom notes he has a history of many ear infections, has had tubes placed in his ears previously, and he again is pulling at his ear and having ear pain.  It is his left ear.  She notes in the past, getting him on medicines sooner than later to treat has been emphasized to help.  He has not had fevers.  She also notes that he struggles with chewing and swallowing normally, and food often sits on his  tongue, he often hoards foods at times in his mouth.  He has a history of oral thrush, and she notes he cannot take a swish and swallow type product as he cannot do that effectively.  He has been on the oral pills for that in the past as a result.  His mom is very involved in his care, and was very knowledgeable about his care and this is the first visit I have had with him or his mother.  And it is a phone visit which is limiting as we discussed.  Mom noted his only allergies to medicines is Levaquin.  Patient Active Problem List   Diagnosis Date Noted  . 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 Seizure (HCC) 08/14/2018  . Muscle spasm 03/18/2015  . Wheelchair bound 03/18/2015  . Inguinal hernia, right 03/18/2015  . Cerebral palsy (HCC) 03/16/2015  . Perennial allergic rhinitis 03/16/2015  . Chronic constipation 03/16/2015  . Insomnia, transient 03/16/2015  . Vitamin D deficiency 03/16/2015    Past Surgical History:  Procedure Laterality Date  . ADENOIDECTOMY    . DENTAL SURGERY  07/30/2011  . TYMPANOSTOMY TUBE PLACEMENT      Family History  Problem Relation Age of Onset  . Obesity Mother   . Diabetes Mother   . Peptic Ulcer Father   . Diabetes Paternal Aunt   . CAD Maternal Aunt   .  Diabetes Maternal Uncle   . Diabetes Maternal Grandmother   . CAD Paternal Grandmother     Social History   Tobacco Use  . Smoking status: Never Smoker  . Smokeless tobacco: Never Used  Substance Use Topics  . Alcohol use: No    Alcohol/week: 0.0 standard drinks     Current Outpatient Medications:  .  azelastine (OPTIVAR) 0.05 % ophthalmic solution, Place 1 drop into both eyes 2 (two) times daily., Disp: 6 mL, Rfl: 1 .  Cholecalciferol 50 MCG (2000 UT) CAPS, Take by mouth., Disp: , Rfl:  .  diazepam (VALIUM) 5 MG tablet, Take 0.5-1 tablets (2.5-5 mg total) by mouth daily as needed for anxiety., Disp: 30 tablet, Rfl: 0 .  fluticasone (FLONASE) 50 MCG/ACT nasal spray, Place 2 sprays  into both nostrils daily., Disp: 16 g, Rfl: 6 .  ibuprofen (ADVIL) 600 MG tablet, Take 1 tablet (600 mg total) by mouth 3 (three) times daily as needed., Disp: 90 tablet, Rfl: 0 .  loratadine (CLARITIN) 10 MG tablet, Take 1 tablet (10 mg total) by mouth daily., Disp: 30 tablet, Rfl: 11 .  metaxalone (SKELAXIN) 800 MG tablet, Take 1 tablet (800 mg total) by mouth 3 (three) times daily., Disp: 90 tablet, Rfl: 1 .  Multiple Vitamins-Minerals (MULTIVITAMIN ADULT PO), Take by mouth., Disp: , Rfl:  .  polyethylene glycol powder (GLYCOLAX/MIRALAX) powder, MIX ONE CAPFUL (17G) IN 8 OUNCES OF FLUIDS AND DRINK BY MOUTH ONCE DAILY, Disp: 527 g, Rfl: 5 .  traZODone (DESYREL) 100 MG tablet, Take 1 tablet (100 mg total) by mouth at bedtime., Disp: 90 tablet, Rfl: 1 .  ZYRTEC ALLERGY 10 MG tablet, , Disp: , Rfl:  .  amoxicillin-clavulanate (AUGMENTIN) 875-125 MG tablet, Take 1 tablet by mouth 2 (two) times daily., Disp: 20 tablet, Rfl: 0 .  fluconazole (DIFLUCAN) 100 MG tablet, Take 1 tablet (100 mg total) by mouth daily for 7 days., Disp: 7 tablet, Rfl: 1  Allergies  Allergen Reactions  . Levofloxacin   . Other     Seasonal allergies    With staff assistance, above reviewed with the patient/caregiver today.  ROS: As per HPI, otherwise no specific complaints on a limited and focused system review   Objective  Virtual encounter, vitals not obtained.  There is no height or weight on file to calculate BMI.  Physical Exam   I had no interaction with the patient, with the only interaction with the mom by phone. She did often note during her call to the patient to relax and to hold on and wait for her response as she was talking with me.  No results found for this or any previous visit (from the past 72 hour(s)).  PHQ2/9: Depression screen [facility] 2/9 10/13/2019 04/14/2019 05/09/2018 03/20/2018 04/30/2017  Decreased Interest 0 0 0 - 0  Down, Depressed, Hopeless 0 0 0 0 -  PHQ - 2 Score 0 0 0 0 0  Altered  sleeping 0 0 0 0 -  Tired, decreased energy 0 0 0 0 -  Change in appetite 0 0 0 0 -  Feeling bad or failure about yourself  0 0 0 0 -  Trouble concentrating 0 0 0 0 -  Moving slowly or fidgety/restless 0 0 0 0 -  Suicidal thoughts 0 0 0 0 -  PHQ-9 Score 0 0 0 - -  Difficult doing work/chores - Not difficult at all Not difficult at all Not difficult at all -   PHQ-2/9  Fall Risk: Fall Risk  02/03/2020 10/13/2019 08/13/2019 04/14/2019 05/09/2018  Falls in the past year? 0 0 0 0 0  Number falls in past yr: 0 0 0 0 -  Injury with Fall? 0 0 0 0 0  Risk for fall due to : - - - - -     Assessment & Plan  1. Otitis of left ear Very limited with this being a phone call, although assessing the patient's history and with mom's input, enough of a concern exists for an ear infection that it is felt best to add an antibiotic.  Mom was very much in favor of this approach. We will add Augmentin-twice daily,  Emphasized if symptoms not improving or more problematic, the importance of him being seen to further assess. - amoxicillin-clavulanate (AUGMENTIN) 875-125 MG tablet; Take 1 tablet by mouth 2 (two) times daily.  Dispense: 20 tablet; Refill: 0  2. Thrush She noted he has had this many times in the past, and is concerned it has recurred, and that he does not tolerate the swish and swallow product. We will prescribe Diflucan-100 mg daily for 7 days, and await his response. He notes he has had an oral antifungal in the past for this treatment and has done well with that. - fluconazole (DIFLUCAN) 100 MG tablet; Take 1 tablet (100 mg total) by mouth daily for 7 days.  Dispense: 7 tablet; Refill: 1  3. Seizure (HCC) 4. Spastic hemiplegic cerebral palsy (HCC) 5. Muscle spasm The PDMP was reviewed today, with the last refill 10/13/2018 one of the Valium product, #30. Do feel a refill can be provided today and it is appropriate. We will continue to monitor.  - diazepam (VALIUM) 5 MG tablet; Take 0.5-1  tablets (2.5-5 mg total) by mouth daily as needed for anxiety.  Dispense: 30 tablet; Refill: 0   I discussed the assessment and treatment plan with the patient. The patient was provided an opportunity to ask questions and all were answered. The patient agreed with the plan and demonstrated an understanding of the instructions.  Red flags and when to present for emergency care or RTC including fevers, shortness of breath, new/worsening/un-resolving symptoms reviewed with patient at time of visit.   The patient was advised to call back or seek an in-person evaluation if the symptoms worsen or if the condition fails to improve as anticipated.  I provided 20 minutes of non-face-to-face time during this encounter that included discussing at length patient's sx/history, pertinent pmhx, medications, treatment and follow up plan. This time also included the necessary documentation, orders, and chart review.  Jamelle Haring, MD

## 2020-08-09 NOTE — Progress Notes (Signed)
Name: Nicholas Bautista   MRN: [id number]    DOB: 1985-10-15   Date:08/10/2020       Progress Note  Subjective  Chief Complaint  Acute visit for ear pain   HPI  Cerebral Palsy: he has severe spasm and is wheelchair dependent. He is able to use his  gait trainer.He is now on Skelaxin because flexeril stopped working and tizanidine made him very groggy.He has 24 hour care at home, from mother and sister,day program was closed during covid -19 but just re-openedand mother is not comfortable taking him back yet.  Mother can understand him, but is pretty much non-verbal for a person who does not live with him. He takes Valium prn for severe spasms or anxiety, no recent episodes of seizures. He has been very calm lately   AR: mother states he does not allow anything in his nose, mother gives it to him seldom, taking zyrtecdaily now. Currently not sneezing, no rhinorrheaor nasal congestion.He has been complaining of right ear pain  Chronic Constipation: he takes Miralax every other day to be able to have regular bowel movements. No straining and, blood in stools or abdominal pain. Unchanged   Insomnia:Temazepamwas no longer working for him, and we switched to Trazodone back in Feb 20 and he is doing well on medication, he is on 100 mg qhs. He falls asleep within 30 minutes of taking medication, he seems to have a restful night, very seldom he wakes up during the night . No side effects of medication   Seizure: no episodes in many years. He now mostly takes valium  prn prior to procedures, when spasms are severe or prn seizure. He needs a refill   Keratoconus: developed a change on eye back in Feb, he was seen by ophthalmologist at [facility], but was referred by them to [facility] and needs follow up   Patient Active Problem List   Diagnosis Date Noted  . 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 Seizure (HCC) 08/14/2018  . Muscle spasm 03/18/2015  . Wheelchair bound 03/18/2015   . Inguinal hernia, right 03/18/2015  . Cerebral palsy (HCC) 03/16/2015  . Perennial allergic rhinitis 03/16/2015  . Chronic constipation 03/16/2015  . Insomnia, transient 03/16/2015  . Vitamin D deficiency 03/16/2015    Past Surgical History:  Procedure Laterality Date  . ADENOIDECTOMY    . DENTAL SURGERY  07/30/2011  . TYMPANOSTOMY TUBE PLACEMENT      Family History  Problem Relation Age of Onset  . Obesity Mother   . Diabetes Mother   . Peptic Ulcer Father   . Diabetes Paternal Aunt   . CAD Maternal Aunt   . Diabetes Maternal Uncle   . Diabetes Maternal Grandmother   . CAD Paternal Grandmother     Social History   Tobacco Use  . Smoking status: Never Smoker  . Smokeless tobacco: Never Used  Substance Use Topics  . Alcohol use: No    Alcohol/week: 0.0 standard drinks     Current Outpatient Medications:  .  azelastine (OPTIVAR) 0.05 % ophthalmic solution, Place 1 drop into both eyes 2 (two) times daily., Disp: 6 mL, Rfl: 1 .  Cholecalciferol 50 MCG (2000 UT) CAPS, Take by mouth., Disp: , Rfl:  .  Multiple Vitamins-Minerals (MULTIVITAMIN ADULT PO), Take by mouth., Disp: , Rfl:  .  polyethylene glycol powder (GLYCOLAX/MIRALAX) powder, MIX ONE CAPFUL (17G) IN 8 OUNCES OF FLUIDS AND DRINK BY MOUTH ONCE DAILY, Disp: 527 g, Rfl: 5 .  diazepam (VALIUM) 5 MG tablet, Take 0.5-1 tablets (2.5-5 mg total) by mouth daily as needed for anxiety., Disp: 30 tablet, Rfl: 0 .  fluticasone (FLONASE) 50 MCG/ACT nasal spray, Place 2 sprays into both nostrils daily., Disp: 16 g, Rfl: 2 .  ibuprofen (ADVIL) 600 MG tablet, Take 1 tablet (600 mg total) by mouth 3 (three) times daily as needed., Disp: 90 tablet, Rfl: 1 .  metaxalone (SKELAXIN) 800 MG tablet, Take 1 tablet (800 mg total) by mouth 3 (three) times daily., Disp: 90 tablet, Rfl: 1 .  traZODone (DESYREL) 100 MG tablet, Take 1 tablet (100 mg total) by mouth at bedtime., Disp: 90 tablet, Rfl: 1 .  ZYRTEC ALLERGY 10 MG tablet, Take 1  tablet (10 mg total) by mouth daily., Disp: 90 tablet, Rfl: 1  Allergies  Allergen Reactions  . Levofloxacin   . Other     Seasonal allergies    I personally reviewed active problem list, medication list, allergies, family history, social history, health maintenance with the patient/caregiver today.   ROS  Constitutional: Negative for fever or weight change.  Respiratory: Negative for cough and shortness of breath.   Cardiovascular: Negative for chest pain or palpitations.  Gastrointestinal: Negative for abdominal pain, no bowel changes.  Musculoskeletal: wheelchair bound  Skin: Negative for rash.  Neurological: Negative for dizziness or headache.  No other specific complaints in a complete review of systems (except as listed in HPI above).  Objective  Vitals:   08/10/20 1351  BP: 112/82  Pulse: 84  Resp: 16  Temp: 97.9 F (36.6 C)  TempSrc: Axillary  SpO2: 97%  Weight: 100 lb (45.4 kg)    Body mass index is 17.71 kg/m.  Physical Exam  Constitutional: Patient sitting on wheelchair in no distress, very mild spastic movements today, seems very calm HEENT: head atraumatic, normocephalic, pupils equal and reactive to light, cornea is shiny and has a wrinkle on both side , ears normal neck supple Cardiovascular: Normal rate, regular rhythm and normal heart sounds.  No murmur heard. No BLE edema. Pulmonary/Chest: Effort normal and breath sounds normal. No respiratory distress. Abdominal: Soft.  There is no tenderness. Muscular skeletal: wheelchair bond,  Atrophy and contractions of legs spastic movement of arms, head  Psychiatric: cooperative, engaging, sister understands him but non verbal communication, seems happy   PHQ2/9: Depression screen [facility] 2/9 08/10/2020 10/13/2019 04/14/2019 05/09/2018 03/20/2018  Decreased Interest 0 0 0 0 -  Down, Depressed, Hopeless 0 0 0 0 0  PHQ - 2 Score 0 0 0 0 0  Altered sleeping - 0 0 0 0  Tired, decreased energy - 0 0 0 0  Change in  appetite - 0 0 0 0  Feeling bad or failure about yourself  - 0 0 0 0  Trouble concentrating - 0 0 0 0  Moving slowly or fidgety/restless - 0 0 0 0  Suicidal thoughts - 0 0 0 0  PHQ-9 Score - 0 0 0 -  Difficult doing work/chores - - Not difficult at all Not difficult at all Not difficult at all    phq 9 is negative   Fall Risk: Fall Risk  08/10/2020 02/03/2020 10/13/2019 08/13/2019 04/14/2019  Falls in the past year? 0 0 0 0 0  Number falls in past yr: 0 0 0 0 0  Injury with Fall? 0 0 0 0 0  Risk for fall due to : - - - - -     Functional Status Survey: Is the  patient deaf or have difficulty hearing?: No Does the patient have difficulty seeing, even when wearing glasses/contacts?: No Does the patient have difficulty concentrating, remembering, or making decisions?: No Does the patient have difficulty walking or climbing stairs?: Yes Does the patient have difficulty dressing or bathing?: No Does the patient have difficulty doing errands alone such as visiting a doctor's office or shopping?: Yes   Assessment & Plan  1. Seizure (HCC)  - diazepam (VALIUM) 5 MG tablet; Take 0.5-1 tablets (2.5-5 mg total) by mouth daily as needed for anxiety.  Dispense: 30 tablet; Refill: 0  2. Spastic hemiplegic cerebral palsy (HCC)  - diazepam (VALIUM) 5 MG tablet; Take 0.5-1 tablets (2.5-5 mg total) by mouth daily as needed for anxiety.  Dispense: 30 tablet; Refill: 0 - metaxalone (SKELAXIN) 800 MG tablet; Take 1 tablet (800 mg total) by mouth 3 (three) times daily.  Dispense: 90 tablet; Refill: 1  3. Muscle spasm  - diazepam (VALIUM) 5 MG tablet; Take 0.5-1 tablets (2.5-5 mg total) by mouth daily as needed for anxiety.  Dispense: 30 tablet; Refill: 0 - metaxalone (SKELAXIN) 800 MG tablet; Take 1 tablet (800 mg total) by mouth 3 (three) times daily.  Dispense: 90 tablet; Refill: 1 - ibuprofen (ADVIL) 600 MG tablet; Take 1 tablet (600 mg total) by mouth 3 (three) times daily as needed.  Dispense: 90  tablet; Refill: 1  4. Needs flu shot  Today   5. Wheelchair bound   6. Keratoconus of both eyes  Both eyes now, we will send him back to [facility]  7. Other insomnia  - traZODone (DESYREL) 100 MG tablet; Take 1 tablet (100 mg total) by mouth at bedtime.  Dispense: 90 tablet; Refill: 1  8. Otalgia, right  Reassurance given, advised to give tylenol prn and try nasal spray for a few days   9. Seizure (HCC)  - diazepam (VALIUM) 5 MG tablet; Take 0.5-1 tablets (2.5-5 mg total) by mouth daily as needed for anxiety.  Dispense: 30 tablet; Refill: 0  10. Agitation  - diazepam (VALIUM) 5 MG tablet; Take 0.5-1 tablets (2.5-5 mg total) by mouth daily as needed for anxiety.  Dispense: 30 tablet; Refill: 0  11. Nasal congestion  - fluticasone (FLONASE) 50 MCG/ACT nasal spray; Place 2 sprays into both nostrils daily.  Dispense: 16 g; Refill: 2 - ZYRTEC ALLERGY 10 MG tablet; Take 1 tablet (10 mg total) by mouth daily.  Dispense: 90 tablet; Refill: 1  12. Perennial allergic rhinitis   13. Need for immunization against influenza  - Flu Vaccine QUAD 36+ mos IM

## 2020-08-10 ENCOUNTER — Ambulatory Visit (INDEPENDENT_AMBULATORY_CARE_PROVIDER_SITE_OTHER): Payer: Medicaid Other | Admitting: Family Medicine

## 2020-08-10 ENCOUNTER — Encounter: Payer: Self-pay | Admitting: Family Medicine

## 2020-08-10 ENCOUNTER — Ambulatory Visit: Payer: Medicaid Other | Admitting: Family Medicine

## 2020-08-10 ENCOUNTER — Other Ambulatory Visit: Payer: Self-pay

## 2020-08-10 VITALS — BP 112/82 | HR 84 | Temp 97.9°F | Resp 16 | Wt 100.0 lb

## 2020-08-10 DIAGNOSIS — M62838 Other muscle spasm: Secondary | ICD-10-CM | POA: Diagnosis not present

## 2020-08-10 DIAGNOSIS — R0981 Nasal congestion: Secondary | ICD-10-CM

## 2020-08-10 DIAGNOSIS — R569 Unspecified convulsions: Secondary | ICD-10-CM | POA: Diagnosis not present

## 2020-08-10 DIAGNOSIS — G4709 Other insomnia: Secondary | ICD-10-CM

## 2020-08-10 DIAGNOSIS — G802 Spastic hemiplegic cerebral palsy: Secondary | ICD-10-CM | POA: Diagnosis not present

## 2020-08-10 DIAGNOSIS — H18603 Keratoconus, unspecified, bilateral: Secondary | ICD-10-CM

## 2020-08-10 DIAGNOSIS — H9201 Otalgia, right ear: Secondary | ICD-10-CM

## 2020-08-10 DIAGNOSIS — R451 Restlessness and agitation: Secondary | ICD-10-CM

## 2020-08-10 DIAGNOSIS — Z23 Encounter for immunization: Secondary | ICD-10-CM

## 2020-08-10 DIAGNOSIS — Z993 Dependence on wheelchair: Secondary | ICD-10-CM

## 2020-08-10 DIAGNOSIS — J3089 Other allergic rhinitis: Secondary | ICD-10-CM

## 2020-08-10 MED ORDER — IBUPROFEN 600 MG PO TABS: 600.0000 mg | ORAL_TABLET | Freq: Three times a day (TID) | ORAL | 1 refills | Status: DC | PRN

## 2020-08-10 MED ORDER — METAXALONE 800 MG PO TABS: 800.0000 mg | ORAL_TABLET | Freq: Three times a day (TID) | ORAL | 1 refills | Status: DC

## 2020-08-10 MED ORDER — FLUTICASONE PROPIONATE 50 MCG/ACT NA SUSP: 2.0000 | Freq: Every day | NASAL | 2 refills | Status: DC

## 2020-08-10 MED ORDER — ZYRTEC ALLERGY 10 MG PO TABS: 10.0000 mg | ORAL_TABLET | Freq: Every day | ORAL | 1 refills | Status: DC

## 2020-08-10 MED ORDER — TRAZODONE HCL 100 MG PO TABS: 100.0000 mg | ORAL_TABLET | Freq: Every day | ORAL | 1 refills | Status: DC

## 2020-08-10 MED ORDER — DIAZEPAM 5 MG PO TABS: 2.5000 mg | ORAL_TABLET | Freq: Every day | ORAL | 0 refills | Status: DC | PRN

## 2020-10-18 ENCOUNTER — Telehealth: Payer: Self-pay

## 2020-10-18 NOTE — Telephone Encounter (Signed)
Copied from CRM [id number]. Topic: General - Other >> Oct 18, 2020 10:09 AM Elliot Gault wrote: Caller name: Rosealee Albee  Relation to pt: from [facility] Case Manger  Call back number: [phone] fax # [phone]    Reason for call:  Checking on the status of medical necessity form faxed to [phone] on 10/04/2020, please note if received. Form will be re faxed again today

## 2020-10-27 LAB — HM DIABETES EYE EXAM

## 2020-10-28 NOTE — Telephone Encounter (Addendum)
Nicholas Bautista with [facility] health case management needs a letter stating the patient need his ramp outside his house  repair for his wheelchair on our letterhead please fax to [phone]

## 2020-10-31 ENCOUNTER — Encounter: Payer: Self-pay | Admitting: Family Medicine

## 2020-10-31 NOTE — Telephone Encounter (Signed)
Completed.

## 2020-11-01 ENCOUNTER — Encounter: Payer: Self-pay | Admitting: Family Medicine

## 2020-11-01 ENCOUNTER — Ambulatory Visit: Payer: Self-pay | Admitting: *Deleted

## 2020-11-01 DIAGNOSIS — H18603 Keratoconus, unspecified, bilateral: Secondary | ICD-10-CM

## 2020-11-01 NOTE — Telephone Encounter (Signed)
Nicholas Bautista, from [facility], patients Care Manager [phone] is requesting a revision of requested letter for repair of patient's entrance ramp. Please consider letter to state," In my medical opinion, Nicholas Bautista, Nicholas Bautista needs entrance ramp to his home to be repaired". Please fax updated letter to [phone] asap. Thank you for your efforts to get patient's safety needs met.

## 2020-11-01 NOTE — Telephone Encounter (Signed)
Completed and faxed.

## 2020-11-09 NOTE — Telephone Encounter (Signed)
completed

## 2020-11-09 NOTE — Telephone Encounter (Addendum)
Nicholas Bautista did received the letter however it needs to have dr Carlynn Purl MD on the letter and please fax soon as possible [phone]

## 2020-12-09 ENCOUNTER — Other Ambulatory Visit: Payer: Self-pay

## 2020-12-09 DIAGNOSIS — H189 Unspecified disorder of cornea: Secondary | ICD-10-CM

## 2020-12-09 DIAGNOSIS — G802 Spastic hemiplegic cerebral palsy: Secondary | ICD-10-CM

## 2020-12-28 ENCOUNTER — Ambulatory Visit: Payer: Self-pay | Admitting: *Deleted

## 2020-12-28 NOTE — Telephone Encounter (Signed)
Pt notified and is actually on the way to [facility]

## 2020-12-28 NOTE — Telephone Encounter (Signed)
Mother of patient calling. Patient woke up with a hazy film over the right eyeball and clear drainage this morning. Says he is indicating it is painful. White of the eye is red.No fever no other symptoms. No one else sick in the home. No availability with office. She has called [facility] and is waiting to hear from them.  She would like a referral sent if the patient will need one to be seen.     Reason for Disposition  MODERATE eye pain or discomfort (e.g., interferes with normal activities or awakens from sleep; more than mild)  Answer Assessment - Initial Assessment Questions 1. ONSET: "When did the pain start?" (e.g., minutes, hours, days)     Today 2. TIMING: "Does the pain come and go, or has it been constant since it started?" (e.g., constant, intermittent, fleeting)     constant 3. SEVERITY: "How bad is the pain?"   (Scale 1-10; mild, moderate or severe)   - MILD (1-3): doesn't interfere with normal activities    - MODERATE (4-7): interferes with normal activities or awakens from sleep    - SEVERE (8-10): excruciating pain and patient unable to do normal activities     Unable to know exactly due to disability. 4. LOCATION: "Where does it hurt?"  (e.g., eyelid, eye, cheekbone)     eye 5. CAUSE: "What do you think is causing the pain?"     Unsure but there is a hazy film across the eyeball 6. VISION: "Do you have blurred vision or changes in your vision?"      Unable to determine due to patient's disability 7. EYE DISCHARGE: "Is there any discharge (pus) from the eye(s)?"  If yes, ask: "What color is it?"      Clear discharge today 8. FEVER: "Do you have a fever?" If Yes, ask: "What is it, how was it measured, and when did it start?"      none 9. OTHER SYMPTOMS: "Do you have any other symptoms?" (e.g., headache, nasal discharge, facial rash)     none 10. PREGNANCY: "Is there any chance you are pregnant?" "When was your last m NA  Protocols used: Eye Pain and Other  Symptoms-A-AH

## 2021-07-27 ENCOUNTER — Ambulatory Visit: Payer: Self-pay

## 2021-07-27 NOTE — Telephone Encounter (Signed)
°  Chief Complaint: COVID Symptoms: cough, fatigue, fever last night Frequency: 1-2 days Pertinent Negatives: Patient denies fever today Disposition: [] ED /[] Urgent Care (no appt availability in office) / [x] Appointment(In office/virtual)/ []  [facility]/ [] Home Care/ [] Refused Recommended Disposition /[] [facility]/ []  Follow-up with PCP Additional Notes: Spoke with pt's mother, she is COVID positive as well  but on day 4 of quarantine. Pt has just mild symptoms currently.   Reason for Disposition  [1] HIGH RISK for severe COVID complications (e.g., weak immune system, age > 64 years, obesity with BMI > 25, pregnant, chronic lung disease or other chronic medical condition) AND [2] COVID symptoms (e.g., cough, fever)  (Exceptions: Already seen by PCP and no new or worsening symptoms.)  Answer Assessment - Initial Assessment Questions 1. COVID-19 DIAGNOSIS: "Who made your COVID-19 diagnosis?" "Was it confirmed by a positive lab test or self-test?" If not diagnosed by a doctor (or NP/PA), ask "Are there lots of cases (community spread) where you live?" Note: See public health department website, if unsure.     Home test 3. ONSET: "When did the COVID-19 symptoms start?"      Yesterday  4. WORST SYMPTOM: "What is your worst symptom?" (e.g., cough, fever, shortness of breath, muscle aches)     cough 5. COUGH: "Do you have a cough?" If Yes, ask: "How bad is the cough?"       Yes mild 6. FEVER: "Do you have a fever?" If Yes, ask: "What is your temperature, how was it measured, and when did it start?"     No 9. HIGH RISK DISEASE: "Do you have any chronic medical problems?" (e.g., asthma, heart or lung disease, weak immune system, obesity, etc.)     Celebral palsy 10. VACCINE: "Have you had the COVID-19 vaccine?" If Yes, ask: "Which one, how many shots, when did you get it?"       yes 11. BOOSTER: "Have you received your COVID-19 booster?" If Yes, ask: "Which one and when did  you get it?"       no 13. OTHER SYMPTOMS: "Do you have any other symptoms?"  (e.g., chills, fatigue, headache, loss of smell or taste, muscle pain, sore throat)       fatigue  Protocols used: Coronavirus (COVID-19) Diagnosed or Suspected-A-AH

## 2021-07-28 ENCOUNTER — Telehealth (INDEPENDENT_AMBULATORY_CARE_PROVIDER_SITE_OTHER): Payer: Medicaid Other | Admitting: Nurse Practitioner

## 2021-07-28 DIAGNOSIS — U071 COVID-19: Secondary | ICD-10-CM

## 2021-07-28 DIAGNOSIS — G802 Spastic hemiplegic cerebral palsy: Secondary | ICD-10-CM

## 2021-07-28 MED ORDER — MOLNUPIRAVIR EUA 200MG CAPSULE: 4.0000 | ORAL_CAPSULE | Freq: Two times a day (BID) | ORAL | 0 refills | Status: AC

## 2021-07-28 NOTE — Progress Notes (Signed)
Name: Nicholas Bautista   MRN: [id number]    DOB: Nov 02, 1985   Date:07/28/2021       Progress Note  Subjective  Chief Complaint  Chief Complaint  Patient presents with   Cough    Pt sister believes x2 days ago slight fever COVID Home test- Positive pt mom also tested Positive but on 1/23   Nasal Congestion    I connected with  KAIKANE BENGEL Bautista  on 07/28/21 at  1:00 PM EST by a video enabled telemedicine application and verified that I am speaking with the correct person using two identifiers.  I discussed the limitations of evaluation and management by telemedicine and the availability of in person appointments. The patient expressed understanding and agreed to proceed with a virtual visit  Staff also discussed with the patient that there may be a patient responsible charge related to this service. Patient Location: home Provider Location: [facility] Additional Individuals present: patient's sister  HPI  Covid- 10: His mother also has covid. Symptoms started yesterday, tested positive for covid last night.  Symptoms include cough and nasal congestion. Sister says that he has been taking Dayquil, mucinex and vitamin C.  They also have been using vicks vapor rub and gave him a vapro bath which helped.  He is also taking zyrtec and flonase.  Denies any shortness of breath or fever.  Discussed antiviral treatment and they do want to do that. Discussed side effects.  Will send in [place].  Discussed concerning signs and symptoms to seek emergency care. Discussed pushing fluids and getting rest.   Cerebral palsy: He is wheelchair dependent.  He has 24 hour care at home with mother and sister.  He is pretty much non-verbal to those who do not live with him, but mother can understand him. I did hear him say no when asked if he needed cough medication.   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, Disp: 6 mL, Rfl: 1   Cholecalciferol 50 MCG (2000 UT) CAPS, Take by mouth., Disp: , Rfl:    diazepam (VALIUM) 5 MG tablet, Take 0.5-1 tablets (2.5-5 mg total) by mouth daily as needed for anxiety., Disp: 30 tablet, Rfl: 0   fluticasone (FLONASE) 50 MCG/ACT nasal spray, Place 2 sprays into both nostrils daily., Disp: 16 g, Rfl: 2   ibuprofen (ADVIL) 600 MG tablet, Take 1 tablet (600 mg total) by mouth 3 (three) times daily as needed., Disp: 90 tablet, Rfl: 1   metaxalone (SKELAXIN) 800 MG tablet, Take 1 tablet (800 mg total) by mouth 3 (three) times daily., Disp: 90 tablet, Rfl: 1   Multiple Vitamins-Minerals (MULTIVITAMIN ADULT PO), Take by mouth., Disp: , Rfl:    polyethylene glycol powder (GLYCOLAX/MIRALAX) powder, MIX ONE CAPFUL (17G) IN 8 OUNCES OF FLUIDS AND DRINK  BY MOUTH ONCE DAILY, Disp: 527 g, Rfl: 5   traZODone (DESYREL) 100 MG tablet, Take 1 tablet (100 mg total) by mouth at bedtime., Disp: 90 tablet, Rfl: 1   ZYRTEC ALLERGY 10 MG tablet, Take 1 tablet (10 mg total) by mouth daily., Disp: 90 tablet, Rfl: 1  Allergies  Allergen Reactions   Levofloxacin    Other     Seasonal allergies    I personally reviewed active problem list, medication list, allergies, notes from last encounter with the patient/caregiver today.  ROS  Constitutional: Negative for fever or weight change.  HEENT: positive for nasal congestion Respiratory: Positive  for cough and negative for shortness of breath.   Cardiovascular: Negative for chest pain or palpitations.  Gastrointestinal: Negative for abdominal pain, no bowel changes.  Musculoskeletal: Negative for gait problem or joint swelling.  Skin: Negative for rash.  Neurological: Negative for dizziness or headache.  No other specific complaints in a complete review of systems (except as listed in HPI above).   Objective  Virtual encounter, vitals not obtained.  There is no height or weight on file to calculate BMI.  Nursing Note and Vital Signs reviewed.  Physical Exam  Awake, alert, acting his norm per sister  No results found for this or any previous visit (from the past 47 hour(s)).  Assessment & Plan  1. COVID-19 -push fluids, get rest -continue OTC treatments for symptoms - molnupiravir EUA (LAGEVRIO) 200 mg CAPS capsule; Take 4 capsules (800 mg total) by mouth 2 (two) times daily for 5 days.  Dispense: 40 capsule; Refill: 0  2. Spastic hemiplegic cerebral palsy (HCC) -continue current treatments  -Red flags and when to present for emergency care or RTC including fever >101.53F, chest pain, shortness of breath, new/worsening/un-resolving symptoms,  reviewed with patient at time of visit. Follow up and care instructions discussed and provided in AVS. - I discussed the assessment and treatment plan with the patient. The patient was provided an opportunity to ask questions and all were answered. The patient agreed with the plan and demonstrated an understanding of the instructions.  I provided 15 minutes of non-face-to-face time during this encounter.  Bo Merino, FNP

## 2021-11-29 ENCOUNTER — Ambulatory Visit (INDEPENDENT_AMBULATORY_CARE_PROVIDER_SITE_OTHER): Payer: Medicaid Other | Admitting: Family Medicine

## 2021-11-29 ENCOUNTER — Encounter: Payer: Self-pay | Admitting: Family Medicine

## 2021-11-29 VITALS — Ht 62.0 in | Wt 100.0 lb

## 2021-11-29 DIAGNOSIS — J069 Acute upper respiratory infection, unspecified: Secondary | ICD-10-CM | POA: Diagnosis not present

## 2021-11-29 NOTE — Progress Notes (Signed)
Virtual Visit via Telephone Note  I connected with Nicholas Bautista on 11/29/21 at  8:40 AM EDT by telephone and verified that I am speaking with the correct person using two identifiers.  Location: Patient: home Provider: [facility] Mom also on call. Patient limited verbally.    I discussed the limitations, risks, security and privacy concerns of performing an evaluation and management service by telephone and the availability of in person appointments. I also discussed with the patient that there may be a patient responsible charge related to this service. The patient expressed understanding and agreed to proceed.   History of Present Illness:  UPPER RESPIRATORY TRACT INFECTION - previous COVID in Jan. Hasn't tested yet. - symptom onset 1 week ago  Fever: no, 97.7 this am Cough: yes, productive of phelegm Shortness of breath: no Wheezing: no Chest pain: no Chest tightness: no Chest congestion:  some Nasal congestion: yes Runny nose: yes Post nasal drip: yes Sinus pressure: unsure Headache:  unsure Ear pain: yes bilateral Eyes red/itching:yes Vomiting: no Sick contacts: no Recurrent sinusitis: no Relief with OTC cold/cough medications:  yes   Treatments attempted: dayquil, vitamin C, zinc, generic cold/sinus, tylenol, mucinex    Observations/Objective:  Patient had trouble connecting to video visit, entirety of visit conducted over the phone with mother.   Assessment and Plan:  VIRAL URI Doing well with mild sx and responsive to OTC symptom relief. Will defer COVID testing due to duration of symptoms, outside of treatment window. Reviewed OTC symptom relief and emergency precautions.      I discussed the assessment and treatment plan with the patient. The patient was provided an opportunity to ask questions and all were answered. The patient agreed with the plan and demonstrated an understanding of the instructions.   The patient was advised to call back or seek an  in-person evaluation if the symptoms worsen or if the condition fails to improve as anticipated.  I provided 12 minutes of non-face-to-face time during this encounter.   Nicholas Laroche, DO

## 2021-11-30 ENCOUNTER — Other Ambulatory Visit: Payer: Self-pay | Admitting: Family Medicine

## 2021-11-30 DIAGNOSIS — G802 Spastic hemiplegic cerebral palsy: Secondary | ICD-10-CM

## 2021-11-30 DIAGNOSIS — M62838 Other muscle spasm: Secondary | ICD-10-CM

## 2021-12-22 ENCOUNTER — Other Ambulatory Visit: Payer: Self-pay | Admitting: Nurse Practitioner

## 2021-12-22 DIAGNOSIS — G802 Spastic hemiplegic cerebral palsy: Secondary | ICD-10-CM

## 2021-12-22 DIAGNOSIS — M62838 Other muscle spasm: Secondary | ICD-10-CM

## 2022-02-09 ENCOUNTER — Ambulatory Visit: Payer: Self-pay

## 2022-02-09 NOTE — Telephone Encounter (Signed)
  Chief Complaint: earache Symptoms: L ear pain, runny nose, cough, low grade fever Frequency: yesterday Pertinent Negatives: Patient denies drainage Disposition: [] ED /[x] Urgent Care (no appt availability in office) / [] Appointment(In office/virtual)/ []  [facility]/ [] Home Care/ [] Refused Recommended Disposition /[] [facility]/ []  Follow-up with PCP Additional Notes: pt's mom states pt feels warm to touch, went to wipe ear and pt flenched. Advised since office is closing and going into weekend suggested UC. Offered appt for this evening but already has plans so scheduled appt tomorrow at 1115.   Summary: Left ear pain   Pts mother is calling to report that patient is having left ear pain. Mother is looking for an antibotic. Advised that the office was about the close. Looking for advice      Reason for Disposition  Earache  (Exceptions: brief ear pain of < 60 minutes duration, earache occurring during air travel  Answer Assessment - Initial Assessment Questions 1. LOCATION: "Which ear is involved?"     L ear  2. ONSET: "When did the ear start hurting"      Yesterday 3. SEVERITY: "How bad is the pain?"  (Scale 1-10; mild, moderate or severe)   - MILD (1-3): doesn't interfere with normal activities    - MODERATE (4-7): interferes with normal activities or awakens from sleep    - SEVERE (8-10): excruciating pain, unable to do any normal activities      Flinched away when wiping around ear  4. URI SYMPTOMS: "Do you have a runny nose or cough?"     yes 5. FEVER: "Do you have a fever?" If Yes, ask: "What is your temperature, how was it measured, and when did it start?"     Feels warm to touch  7. OTHER SYMPTOMS: "Do you have any other symptoms?" (e.g., headache, stiff neck, dizziness, vomiting, runny nose, decreased hearing)     Sneezing  Protocols used: Earache-A-AH

## 2022-02-10 ENCOUNTER — Ambulatory Visit
Admission: RE | Admit: 2022-02-10 | Discharge: 2022-02-10 | Disposition: A | Discharge status: Home / Self Care | Payer: Medicaid Other | Source: Ambulatory Visit | Attending: Emergency Medicine | Admitting: Emergency Medicine

## 2022-02-10 VITALS — BP 136/70 | HR 99 | Temp 98.1°F | Resp 19

## 2022-02-10 DIAGNOSIS — H60502 Unspecified acute noninfective otitis externa, left ear: Secondary | ICD-10-CM

## 2022-02-10 MED ORDER — NEOMYCIN-POLYMYXIN-HC 1 % OT SOLN: 3.0000 [drp] | Freq: Four times a day (QID) | OTIC | 0 refills | Status: DC

## 2022-02-10 NOTE — Discharge Instructions (Addendum)
Use the eardrops as directed.  Follow-up with your son's primary care provider if his symptoms are not improving.

## 2022-02-10 NOTE — ED Provider Notes (Signed)
[facility]    CSN: [id number] Arrival date & time: 02/10/22  1209      History   Chief Complaint Chief Complaint  Patient presents with   Ear Fullness    L earache// medicaid// [id number]// pec nt - Entered by patient    HPI Nicholas Bautista is a 36 y.o. male.  Accompanied by his mother, patient presents with left ear pain x 2 days.  No fever, ear drainage, cough, shortness of breath, vomiting, diarrhea, or other symptoms.  No treatments at home.  His medical history includes cerebral palsy, seizures, wheelchair-bound.  The history is provided by a parent, medical records and the patient.    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 Authorizing Provider  NEOMYCIN-POLYMYXIN-HYDROCORTISONE (CORTISPORIN) 1 % SOLN OTIC solution Place 3 drops into the left ear 4 (four) times daily. 02/10/22  Yes Mickie Bail, NP  azelastine (OPTIVAR) 0.05 % ophthalmic solution Place 1 drop into both eyes 2 (two) times daily. 10/13/19   Alba Cory, MD  Cholecalciferol 50 MCG (2000 UT) CAPS Take by mouth.    [provider]  diazepam (VALIUM) 5 MG tablet Take 0.5-1 tablets (2.5-5 mg total) by mouth daily as needed for anxiety. 08/10/20   Alba Cory, MD  fluticasone (FLONASE) 50 MCG/ACT nasal spray Place 2 sprays  into both nostrils daily. 08/10/20   Alba Cory, MD  ibuprofen (ADVIL) 600 MG tablet Take 1 tablet (600 mg total) by mouth 3 (three) times daily as needed. 08/10/20   Alba Cory, MD  metaxalone (SKELAXIN) 800 MG tablet TAKE 1 TABLET BY MOUTH THREE TIMES DAILY 12/22/21   Alba Cory, MD  Multiple Vitamins-Minerals (MULTIVITAMIN ADULT PO) Take by mouth.    [provider]  polyethylene glycol powder (GLYCOLAX/MIRALAX) powder MIX ONE CAPFUL (17G) IN 8 OUNCES OF FLUIDS AND DRINK BY MOUTH ONCE DAILY 03/07/17   Alba Cory, MD  traZODone (DESYREL) 100 MG tablet Take 1 tablet (100 mg total) by mouth at bedtime. 08/10/20   Alba Cory, MD  ZYRTEC ALLERGY 10 MG tablet Take 1 tablet (10 mg total) by mouth daily. 08/10/20   Alba Cory, MD    Family History Family History  Problem Relation Age of Onset   Obesity Mother    Diabetes Mother    Peptic Ulcer Father    Diabetes Paternal Aunt    CAD Maternal Aunt    Diabetes Maternal Uncle    Diabetes Maternal Grandmother    CAD Paternal Grandmother     Social History Social History   Tobacco Use   Smoking status: Never   Smokeless tobacco: Never  Vaping Use   Vaping Use: Never used  Substance Use Topics   Alcohol use: No  Alcohol/week: 0.0 standard drinks of alcohol   Drug use: No     Allergies   Levofloxacin and Other   Review of Systems Review of Systems  Constitutional:  Negative for chills and fever.  HENT:  Positive for ear pain. Negative for ear discharge and sore throat.   Respiratory:  Negative for cough and shortness of breath.   Gastrointestinal:  Negative for diarrhea and vomiting.  Skin:  Negative for color change and rash.  All other systems reviewed and are negative.    Physical Exam Triage Vital Signs ED Triage Vitals  Enc Vitals Group     BP      Pulse      Resp      Temp      Temp src      SpO2      Weight      Height      Head Circumference      Peak Flow      Pain Score       Pain Loc      Pain Edu?      Excl. in GC?    No data found.  Updated Vital Signs BP 136/70   Pulse 99   Temp 98.1 F (36.7 C)   Resp 19   SpO2 95%   Visual Acuity Right Eye Distance:   Left Eye Distance:   Bilateral Distance:    Right Eye Near:   Left Eye Near:    Bilateral Near:     Physical Exam Vitals and nursing note reviewed.  Constitutional:      General: He is not in acute distress.    Appearance: He is well-developed. He is not ill-appearing.  HENT:     Right Ear: Tympanic membrane and ear canal normal.     Left Ear: Tympanic membrane normal.     Ears:     Comments: Left ear canal mildly erythematous.    Nose: Nose normal.     Mouth/Throat:     Mouth: Mucous membranes are moist.     Pharynx: Oropharynx is clear.  Cardiovascular:     Rate and Rhythm: Normal rate and regular rhythm.     Heart sounds: Normal heart sounds.  Pulmonary:     Effort: Pulmonary effort is normal. No respiratory distress.     Breath sounds: Normal breath sounds.  Musculoskeletal:     Cervical back: Neck supple.  Skin:    General: Skin is warm and dry.  Neurological:     Mental Status: He is alert.      UC Treatments / Results  Labs (all labs ordered are listed, but only abnormal results are displayed) Labs Reviewed - No data to display  EKG   Radiology No results found.  Procedures Procedures (including critical care time)  Medications Ordered in UC Medications - No data to display  Initial Impression / Assessment and Plan / UC Course  I have reviewed the triage vital signs and the nursing notes.  Pertinent labs & imaging results that were available during my care of the patient were reviewed by me and considered in my medical decision making (see chart for details).   Left otitis externa.  Treating with Cortisporin eardrops.  Instructed mother to give him Tylenol as needed for discomfort.  Instructed her to follow-up with his PCP if his symptoms are not  improving.  Education provided on otitis externa.  Mother agrees to plan of care.   Final Clinical Impressions(s) /  UC Diagnoses   Final diagnoses:  Acute otitis externa of left ear, unspecified type     Discharge Instructions      Use the eardrops as directed.  Follow-up with your son's primary care provider if his symptoms are not improving.     ED Prescriptions     Medication Sig Dispense Auth. Provider   NEOMYCIN-POLYMYXIN-HYDROCORTISONE (CORTISPORIN) 1 % SOLN OTIC solution Place 3 drops into the left ear 4 (four) times daily. 10 mL Mickie Bail, NP      PDMP not reviewed this encounter.   Mickie Bail, NP 02/10/22 1239

## 2022-02-10 NOTE — ED Triage Notes (Signed)
Patient to Urgent Care with mom. Reports patient started complaining about left sided ear ache on Thursday. States that he often gets water into his ears. No known fevers.

## 2022-02-14 ENCOUNTER — Encounter: Payer: Self-pay | Admitting: Family Medicine

## 2022-02-14 ENCOUNTER — Telehealth: Payer: Self-pay | Admitting: Family Medicine

## 2022-02-14 NOTE — Telephone Encounter (Signed)
Copied from CRM [id number]. Topic: General - Other >> Feb 14, 2022 12:04 PM Tiffany B wrote: CMN faxed over on 02/13/2022 to [phone]. Caller requesting letter regarding ramp please fax back to the fax number noted on the CMN form.

## 2022-03-21 ENCOUNTER — Other Ambulatory Visit: Payer: Self-pay | Admitting: Family Medicine

## 2022-03-21 DIAGNOSIS — M62838 Other muscle spasm: Secondary | ICD-10-CM

## 2022-06-12 ENCOUNTER — Other Ambulatory Visit: Payer: Self-pay | Admitting: Family Medicine

## 2022-06-12 DIAGNOSIS — M62838 Other muscle spasm: Secondary | ICD-10-CM

## 2022-06-12 DIAGNOSIS — G802 Spastic hemiplegic cerebral palsy: Secondary | ICD-10-CM

## 2022-06-12 NOTE — Progress Notes (Unsigned)
Name: Nicholas Bautista   MRN: [id number]    DOB: 10-05-1985   Date:06/13/2022       Progress Note  Subjective  Chief Complaint  Medication Refill  HPI  Cerebral Palsy: he has severe spasm and is wheelchair dependent. He is able to use his  gait trainer.  He is now on Skelaxin because flexeril stopped working and tizanidine made him very groggy. He has 24 hour care at home, from mother and sister.  Mother can understand him, but is pretty much non-verbal for a person who does not live with him. He takes Valium prn for severe spasms or anxiety, no recent episodes of seizures.  He is getting PT and taking skelaxin twice daily to control symptoms    AR: mother states he does not allow anything in his nose, mother gives it to him seldom, taking zyrtec daily now. Currently not sneezing, no rhinorrhea or nasal congestion.   Chronic Constipation: he was on miralax but bowel movements improved only taking dulcolax about once a week, explained miralax is safer    Insomnia: Temazepam was no longer working for him, and we switched to Trazodone back in Feb 20 and he is doing well on medication, he is on 100 mg but only taking it prn and doing well now  Seizure: no episodes in many years. He now mostly takes valium  prn prior to procedures, when spasms are severe or prn seizure. 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   Seizure (HCC) 08/14/2018   Muscle spasm 03/18/2015   Wheelchair bound 03/18/2015   Inguinal hernia, right 03/18/2015   Cerebral palsy (HCC) 03/16/2015   Perennial allergic rhinitis 03/16/2015   Chronic constipation 03/16/2015   Insomnia, transient 03/16/2015   Vitamin D deficiency 03/16/2015     Past Surgical History:  Procedure Laterality Date   ADENOIDECTOMY     DENTAL SURGERY  07/30/2011   TYMPANOSTOMY TUBE PLACEMENT      Family History  Problem Relation Age of Onset   Obesity Mother    Diabetes Mother    Peptic Ulcer Father    Diabetes Paternal Aunt    CAD Maternal Aunt    Diabetes Maternal Uncle    Diabetes Maternal Grandmother    CAD Paternal Grandmother     Social History   Tobacco Use   Smoking status: Never   Smokeless tobacco: Never  Substance Use Topics   Alcohol use: No    Alcohol/week: 0.0 standard drinks of alcohol     Current Outpatient Medications:    bisacodyl (DULCOLAX) 5 MG EC tablet, Take 5 mg by mouth once a week., Disp: , Rfl:    Cholecalciferol 50 MCG (2000 UT) CAPS, Take by mouth., Disp: , Rfl:    diazepam (VALIUM) 5 MG tablet, Take 0.5-1 tablets (2.5-5 mg total) by mouth daily as needed for anxiety., Disp: 30 tablet, Rfl: 0   fluticasone (FLONASE) 50 MCG/ACT nasal spray, Place 2 sprays into both nostrils daily., Disp: 16 g, Rfl: 2   ibuprofen (ADVIL) 600 MG tablet, Take 1 tablet by mouth three times daily as needed, Disp: 90 tablet, Rfl: 0   Multiple Vitamins-Minerals (MULTIVITAMIN ADULT PO),  Take by mouth., Disp: , Rfl:    polyethylene glycol powder (GLYCOLAX/MIRALAX) powder, MIX ONE CAPFUL (17G) IN 8 OUNCES OF FLUIDS AND DRINK BY MOUTH ONCE DAILY, Disp: 527 g, Rfl: 5   traZODone (DESYREL) 100 MG tablet, Take 1 tablet (100 mg total) by mouth at bedtime., Disp: 90 tablet, Rfl: 1   ZYRTEC ALLERGY 10 MG tablet, Take 1 tablet (10 mg total) by mouth daily., Disp: 90 tablet, Rfl: 1   azelastine (OPTIVAR) 0.05 % ophthalmic solution, Place 1 drop into both eyes 2 (two) times daily., Disp: 6 mL, Rfl: 1   metaxalone (SKELAXIN) 800 MG tablet, Take 1 tablet (800 mg total) by mouth 2 (two) times daily., Disp: 180 tablet, Rfl: 1  Allergies  Allergen Reactions   Levofloxacin    Other     Seasonal allergies    I personally reviewed active  problem list, medication list, allergies, family history, social history, health maintenance with the patient/caregiver today.   ROS  Ten systems reviewed and is negative except as mentioned in HPI   Objective  Vitals:   06/13/22 1132  BP: 112/76  Pulse: 96  Resp: 14  Temp: 97.7 F (36.5 C)  TempSrc: Oral  SpO2: 100%  Weight: 143 lb 12.8 oz (65.2 kg)    Body mass index is 26.3 kg/m.  Physical Exam  Constitutional: Patient appears well-developed and well-nourished.  No distress.  HEENT: head atraumatic, normocephalic, cornea is irregular and opaque , neck supple, throat within normal limits Cardiovascular: Normal rate, regular rhythm and normal heart sounds.  No murmur heard. No BLE edema. Pulmonary/Chest: Effort normal and breath sounds normal. No respiratory distress. Abdominal: Soft.  There is no tenderness. Muscular skeletal: spasms, seems happy , excitable  Psychiatric: Patient has a normal mood and affect. behavior is normal. Judgment and thought content normal.    PHQ2/9:    06/13/2022   11:35 AM 11/29/2021    8:29 AM 07/28/2021   12:56 PM 08/10/2020    1:50 PM 10/13/2019   10:04 AM  Depression screen PHQ 2/9  Decreased Interest 0 0 0 0 0  Down, Depressed, Hopeless 0 0 0 0 0  PHQ - 2 Score 0 0 0 0 0  Altered sleeping 0 0 0  0  Tired, decreased energy 0 0 0  0  Change in appetite 0 0 0  0  Feeling bad or failure about yourself  0 0 0  0  Trouble concentrating 0 0 0  0  Moving slowly or fidgety/restless 0 0 0  0  Suicidal thoughts 0 0 0  0  PHQ-9 Score 0 0 0  0  Difficult doing work/chores   Not difficult at all      phq 9 is negative   Fall Risk:    06/13/2022   11:35 AM 11/29/2021    8:28 AM 07/28/2021   12:55 PM 08/10/2020    1:50 PM 02/03/2020    9:35 AM  Fall Risk   Falls in the past year? 1 0 0 0 0  Number falls in past yr: 0  0 0 0  Injury with Fall? 0  0 0 0  Risk for fall due to : Impaired balance/gait;Impaired mobility;Mental status change  Impaired mobility;Impaired balance/gait No Fall Risks    Follow up Falls prevention discussed;Education provided;Falls evaluation completed Falls prevention discussed;Education provided Falls prevention discussed         Assessment & Plan  1. Spastic hemiplegic cerebral palsy (HCC)  - metaxalone (  SKELAXIN) 800 MG tablet; Take 1 tablet (800 mg total) by mouth 2 (two) times daily.  Dispense: 180 tablet; Refill: 1  2. Seizure (HCC)  No recent episodes   3. Perennial allergic rhinitis   4. Muscle spasm  - metaxalone (SKELAXIN) 800 MG tablet; Take 1 tablet (800 mg total) by mouth 2 (two) times daily.  Dispense: 180 tablet; Refill: 1  5. Need for immunization against influenza  - Flu Vaccine QUAD 6+ mos PF IM (Fluarix Quad PF)  6. Keratoconus of both eyes  Keep follow up with ophthalmologist   7. Allergic conjunctivitis of both eyes  - azelastine (OPTIVAR) 0.05 % ophthalmic solution; Place 1 drop into both eyes 2 (two) times daily.  Dispense: 6 mL; Refill: 1

## 2022-06-12 NOTE — Telephone Encounter (Signed)
Appt sch'd for 12.13.2023 w/ Dr Carlynn Purl

## 2022-06-13 ENCOUNTER — Telehealth: Payer: Self-pay

## 2022-06-13 ENCOUNTER — Encounter: Payer: Self-pay | Admitting: Family Medicine

## 2022-06-13 ENCOUNTER — Ambulatory Visit (INDEPENDENT_AMBULATORY_CARE_PROVIDER_SITE_OTHER): Payer: Medicaid Other | Admitting: Family Medicine

## 2022-06-13 VITALS — BP 112/76 | HR 96 | Temp 97.7°F | Resp 14 | Wt 143.8 lb

## 2022-06-13 DIAGNOSIS — H18603 Keratoconus, unspecified, bilateral: Secondary | ICD-10-CM

## 2022-06-13 DIAGNOSIS — J3089 Other allergic rhinitis: Secondary | ICD-10-CM

## 2022-06-13 DIAGNOSIS — R569 Unspecified convulsions: Secondary | ICD-10-CM

## 2022-06-13 DIAGNOSIS — Z23 Encounter for immunization: Secondary | ICD-10-CM

## 2022-06-13 DIAGNOSIS — H1013 Acute atopic conjunctivitis, bilateral: Secondary | ICD-10-CM

## 2022-06-13 DIAGNOSIS — G802 Spastic hemiplegic cerebral palsy: Secondary | ICD-10-CM | POA: Diagnosis not present

## 2022-06-13 DIAGNOSIS — M62838 Other muscle spasm: Secondary | ICD-10-CM | POA: Diagnosis not present

## 2022-06-13 MED ORDER — AZELASTINE HCL 0.05 % OP SOLN: 1.0000 [drp] | Freq: Two times a day (BID) | OPHTHALMIC | 1 refills | Status: DC

## 2022-06-13 MED ORDER — METAXALONE 800 MG PO TABS: 800.0000 mg | ORAL_TABLET | Freq: Two times a day (BID) | ORAL | 1 refills | Status: DC

## 2022-06-13 NOTE — Telephone Encounter (Signed)
Called and completed prior authorizations for Metaxalone 800mg  and Azelastine 0.05%.  Metaxalone: PA has a 24hr turn around time Azelastine: PA #[id number] has been approved 06/13/22-07/13/22  Reference #: 09/11/22

## 2022-06-18 ENCOUNTER — Other Ambulatory Visit: Payer: Self-pay | Admitting: Family Medicine

## 2022-06-18 DIAGNOSIS — M62838 Other muscle spasm: Secondary | ICD-10-CM

## 2022-06-18 DIAGNOSIS — G802 Spastic hemiplegic cerebral palsy: Secondary | ICD-10-CM

## 2022-06-18 DIAGNOSIS — H1013 Acute atopic conjunctivitis, bilateral: Secondary | ICD-10-CM

## 2022-06-18 DIAGNOSIS — R451 Restlessness and agitation: Secondary | ICD-10-CM

## 2022-06-18 DIAGNOSIS — R569 Unspecified convulsions: Secondary | ICD-10-CM

## 2022-06-18 NOTE — Telephone Encounter (Signed)
Called and completed prior authorizations for Metaxalone 800mg  and Azelastine 0.05% on 06/13/22   Metaxalone: PA 06/15/22 has a 24hr turn around time (have not heard if approved or not) Azelastine: PA #[id number] has been approved 06/13/22-07/13/22   Reference #: 09/11/22

## 2022-06-18 NOTE — Telephone Encounter (Unsigned)
Copied from CRM [id number]. Topic: General - Other >> Jun 18, 2022 11:59 AM Everette C wrote: Reason for CRM: Medication Refill - Medication: diazepam (VALIUM) 5 MG tablet [[id number]]   ibuprofen (ADVIL) 600 MG tablet [[id number]]  Has the patient contacted their pharmacy? Yes.   (Agent: If no, request that the patient contact the pharmacy for the refill. If patient does not wish to contact the pharmacy document the reason why and proceed with request.) (Agent: If yes, when and what did the pharmacy advise?)  Preferred Pharmacy (with phone number or street name): [facility] [address]), [place] - [address]) [place] [address] Phone: [phone] Fax: [phone] Hours: Not open 24 hours   Has the patient been seen for an appointment in the last year OR does the patient have an upcoming appointment? Yes.    Agent: Please be advised that RX refills may take up to 3 business days. We ask that you follow-up with your pharmacy.

## 2022-06-18 NOTE — Telephone Encounter (Unsigned)
Copied from CRM [id number]. Topic: General - Other >> Jun 18, 2022 11:55 AM Everette C wrote: Reason for CRM: The patient's mother has been directed to contact their PCP to request completion of a prior authorization for their azelastine (OPTIVAR) 0.05 % ophthalmic solution [[id number]]  and metaxalone (SKELAXIN) 800 MG tablet [[id number]] prescription   Please contact the patient's mother further when possible

## 2022-06-19 NOTE — Telephone Encounter (Signed)
Requested medication (s) are due for refill today: routing for review  Requested medication (s) are on the active medication list: yes  Last refill:  08/10/20 for Valium, 06/13/22 for other requested medication  Future visit scheduled: yes  Notes to clinic:  Unable to refill per protocol, cannot delegate.      Requested Prescriptions  Pending Prescriptions Disp Refills   azelastine (OPTIVAR) 0.05 % ophthalmic solution 6 mL 1    Sig: Place 1 drop into both eyes 2 (two) times daily.     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     Analgesics:  NSAIDS Failed - 06/18/2022  3:37 PM      Failed - Manual Review: Labs are only required if the patient has taken medication for more than 8 weeks.      Failed - Cr in normal range and within 360 days    Creat  Date Value Ref Range Status  10/13/2019 0.88 0.60 - 1.35 mg/dL Final         Failed -  HGB in normal range and within 360 days    Hemoglobin  Date Value Ref Range Status  10/13/2019 16.2 13.2 - 17.1 g/dL Final         Failed - PLT in normal range and within 360 days    Platelets  Date Value Ref Range Status  10/13/2019 249 140 - 400 Thousand/uL Final         Failed - HCT in normal range and within 360 days    HCT  Date Value Ref Range Status  10/13/2019 47.0 38.5 - 50.0 % Final         Failed - eGFR is 30 or above and within 360 days    GFR, Est African American  Date Value Ref Range Status  10/13/2019 130 > OR = 60 mL/min/1.2m Final   GFR, Est Non African American  Date Value Ref Range Status  10/13/2019 112 > OR = 60 mL/min/1.765mFinal         Passed - Patient is not pregnant      Passed - Valid encounter within last 12 months    Recent Outpatient Visits           6 days ago [facility]   6 months ago Viral URI   [place]   10 months ago COVID-19   [facility]   [facility] year [facility]   2 years ago Otitis of left ear   [place]       Future Appointments             In 5 months [facility]tee[facility]             metaxalone (SKELAXIN) 800 MG tablet 180 tablet 1    Sig: Take 1 tablet (800 mg total) by mouth 2 (two) times daily.     Not Delegated - Analgesics:  Muscle Relaxants Failed - 06/18/2022  3:37 PM      Failed - This refill cannot be delegated      Passed - Valid encounter within last 6 months    Recent Outpatient Visits           6 days ago [facility]   6 months ago Viral URI   [place]   10 months ago COVID-19   [facility]   1 year ago [facility]   2 years ago Otitis of left ear   [facility]       Future Appointments             In 5 months SoAncil BoozerKrDrue StagerMD [facility]

## 2022-10-30 ENCOUNTER — Other Ambulatory Visit: Payer: Self-pay | Admitting: Family Medicine

## 2022-10-30 DIAGNOSIS — M62838 Other muscle spasm: Secondary | ICD-10-CM

## 2022-12-14 ENCOUNTER — Ambulatory Visit: Payer: Medicaid Other | Admitting: Family Medicine

## 2023-01-24 NOTE — Progress Notes (Signed)
Name: Nicholas Bautista   MRN: [id number]    DOB: [birth year]   Date:01/25/2023       Progress Note  Subjective  Chief Complaint  Follow Up  HPI  Cerebral Palsy: he has severe spasm and is wheelchair dependent. He is able to use his  gait trainer.  He is now on Skelaxin because flexeril stopped working and tizanidine made him very groggy. He has 24 hour care at home, from mother and sister.  Mother can understand him, but is pretty much non-verbal for a person who does not live with him. He takes Valium prn for severe spasms or anxiety, no recent episodes of seizures.  His mom had a stroke in Feb and he was getting more upset when he could not see her at the rehab facility but she is back at home.    AR: he came in with his sister, only uses nasal spray very seldom when severe symptoms since he does no like nasal spray, he is still  taking zyrtec daily. Currently not sneezing, no rhinorrhea or nasal congestion.   Chronic Constipation: he is taking oral laxatives every other day and bowel movements are regular, reminded again MIralax is safer    Insomnia: Temazepam was no longer working for him, and we switched to Trazodone back in 2020, he is sleeping well on medication   Seizure: no episodes in many years. He now mostly takes valium  prn prior to procedures, when spasms are severe or prn seizure. Unchanged    Keratoconus: developed a change on eye back in Feb 22 , he was seen by ophthalmologist at [facility], but [facility] , they discussed surgery ( corneal transplant), mother decided to hold off for now , last visit was in 2023 and it was stable   Patient Active Problem List   Diagnosis Date Noted   Keratoconus of right eye 10/13/2019   Seizure (HCC) 08/14/2018   Muscle spasm 03/18/2015   Wheelchair bound 03/18/2015   Inguinal hernia, right 03/18/2015   Cerebral palsy (HCC) 03/16/2015   Perennial allergic rhinitis 03/16/2015   Chronic constipation  03/16/2015   Insomnia, transient 03/16/2015   Vitamin D deficiency 03/16/2015    Past Surgical History:  Procedure Laterality Date   ADENOIDECTOMY     DENTAL SURGERY  07/30/2011   TYMPANOSTOMY TUBE PLACEMENT      Family History  Problem Relation Age of Onset   Obesity Mother    Diabetes Mother    Peptic Ulcer Father    Diabetes Paternal Aunt    CAD Maternal Aunt    Diabetes Maternal Uncle    Diabetes Maternal Grandmother    CAD Paternal Grandmother     Social History   Tobacco Use   Smoking status: Never   Smokeless tobacco: Never  Substance Use Topics   Alcohol use: No    Alcohol/week: 0.0 standard drinks of alcohol     Current Outpatient Medications:    azelastine (OPTIVAR) 0.05 % ophthalmic solution, Place 1 drop into both eyes 2 (two) times daily., Disp: 6 mL, Rfl: 1   bisacodyl (DULCOLAX) 5 MG EC tablet, Take 5 mg by mouth once a week., Disp: , Rfl:    Cholecalciferol 50 MCG (2000 UT) CAPS, Take by mouth., Disp: , Rfl:    diazepam (VALIUM) 5 MG tablet, Take 0.5-1 tablets (2.5-5 mg total) by mouth daily as needed for anxiety., Disp: 30 tablet, Rfl: 0   fluticasone (FLONASE) 50  MCG/ACT nasal spray, Place 2 sprays into both nostrils daily., Disp: 16 g, Rfl: 2   ibuprofen (ADVIL) 600 MG tablet, Take 1 tablet by mouth three times daily as needed, Disp: 90 tablet, Rfl: 0   metaxalone (SKELAXIN) 800 MG tablet, Take 1 tablet (800 mg total) by mouth 2 (two) times daily., Disp: 180 tablet, Rfl: 1   Multiple Vitamins-Minerals (MULTIVITAMIN ADULT PO), Take by mouth., Disp: , Rfl:    polyethylene glycol powder (GLYCOLAX/MIRALAX) powder, MIX ONE CAPFUL (17G) IN 8 OUNCES OF FLUIDS AND DRINK BY MOUTH ONCE DAILY, Disp: 527 g, Rfl: 5   traZODone (DESYREL) 100 MG tablet, Take 1 tablet (100 mg total) by mouth at bedtime., Disp: 90 tablet, Rfl: 1   ZYRTEC ALLERGY 10 MG tablet, Take 1 tablet (10 mg total) by mouth daily., Disp: 90 tablet, Rfl: 1  Allergies  Allergen Reactions    Levofloxacin    Other     Seasonal allergies    I personally reviewed active problem list, medication list, allergies, family history, social history, health maintenance with the patient/caregiver today.   ROS  Ten systems reviewed and is negative except as mentioned in HPI   Objective  Vitals:   01/25/23 1034  BP: 112/72  Pulse: 75  Resp: 16  Temp: 98.5 F (36.9 C)  TempSrc: Oral  SpO2: 100%  Weight: 133 lb 12.8 oz (60.7 kg)    Body mass index is 24.47 kg/m.  Physical Exam  Constitutional: Patient appears well-developed and well-nourished. No distress.  HEENT: head atraumatic, normocephalic, pupils equal and reactive to light, ears normal TM, neck supple, throat within normal limits Cardiovascular: Normal rate, regular rhythm and normal heart sounds.  No murmur heard. No BLE edema. Pulmonary/Chest: Effort normal and breath sounds normal. No respiratory distress. Abdominal: Soft.  There is no tenderness. Psychiatric: Appears happy Muscular skeletal: sitting on wheelchair, involuntary movements, constrictions on upper and lower extremities    PHQ2/9:    06/13/2022   11:35 AM 11/29/2021    8:29 AM 07/28/2021   12:56 PM 08/10/2020    1:50 PM 10/13/2019   10:04 AM  Depression screen PHQ 2/9  Decreased Interest 0 0 0 0 0  Down, Depressed, Hopeless 0 0 0 0 0  PHQ - 2 Score 0 0 0 0 0  Altered sleeping 0 0 0  0  Tired, decreased energy 0 0 0  0  Change in appetite 0 0 0  0  Feeling bad or failure about yourself  0 0 0  0  Trouble concentrating 0 0 0  0  Moving slowly or fidgety/restless 0 0 0  0  Suicidal thoughts 0 0 0  0  PHQ-9 Score 0 0 0  0  Difficult doing work/chores   Not difficult at all      phq 9 is negative   Fall Risk:    01/25/2023   10:16 AM 06/13/2022   11:35 AM 11/29/2021    8:28 AM 07/28/2021   12:55 PM 08/10/2020    1:50 PM  Fall Risk   Falls in the past year? 0 1 0 0 0  Number falls in past yr:  0  0 0  Injury with Fall?  0  0 0  Risk for  fall due to : Impaired mobility;Impaired balance/gait Impaired balance/gait;Impaired mobility;Mental status change Impaired mobility;Impaired balance/gait No Fall Risks   Follow up Falls prevention discussed;Education provided;Falls evaluation completed Falls prevention discussed;Education provided;Falls evaluation completed Falls prevention discussed;Education provided Falls prevention  discussed       Functional Status Survey: Is the patient deaf or have difficulty hearing?: No Does the patient have difficulty seeing, even when wearing glasses/contacts?: No Does the patient have difficulty concentrating, remembering, or making decisions?: Yes Does the patient have difficulty walking or climbing stairs?: Yes Does the patient have difficulty dressing or bathing?: Yes Does the patient have difficulty doing errands alone such as visiting a doctor's office or shopping?: Yes    Assessment & Plan  1. Spastic hemiplegic cerebral palsy (HCC)  - metaxalone (SKELAXIN) 800 MG tablet; Take 1 tablet (800 mg total) by mouth 2 (two) times daily.  Dispense: 180 tablet; Refill: 1  2. Seizure (HCC)  Doing well   3. Keratoconus of both eyes  Reminded to go to eye doctor yearly  4. Muscle spasm  - metaxalone (SKELAXIN) 800 MG tablet; Take 1 tablet (800 mg total) by mouth 2 (two) times daily.  Dispense: 180 tablet; Refill: 1  5. Perennial allergic rhinitis  - ZYRTEC ALLERGY 10 MG tablet; Take 1 tablet (10 mg total) by mouth daily.  Dispense: 90 tablet; Refill: 1  6. Other insomnia  - traZODone (DESYREL) 100 MG tablet; Take 1 tablet (100 mg total) by mouth at bedtime.  Dispense: 90 tablet; Refill: 1

## 2023-01-25 ENCOUNTER — Ambulatory Visit (INDEPENDENT_AMBULATORY_CARE_PROVIDER_SITE_OTHER): Payer: MEDICAID | Admitting: Family Medicine

## 2023-01-25 ENCOUNTER — Encounter: Payer: Self-pay | Admitting: Family Medicine

## 2023-01-25 VITALS — BP 112/72 | HR 75 | Temp 98.5°F | Resp 16 | Wt 133.8 lb

## 2023-01-25 DIAGNOSIS — H18603 Keratoconus, unspecified, bilateral: Secondary | ICD-10-CM | POA: Diagnosis not present

## 2023-01-25 DIAGNOSIS — G802 Spastic hemiplegic cerebral palsy: Secondary | ICD-10-CM

## 2023-01-25 DIAGNOSIS — J3089 Other allergic rhinitis: Secondary | ICD-10-CM

## 2023-01-25 DIAGNOSIS — M62838 Other muscle spasm: Secondary | ICD-10-CM | POA: Diagnosis not present

## 2023-01-25 DIAGNOSIS — G4709 Other insomnia: Secondary | ICD-10-CM

## 2023-01-25 DIAGNOSIS — R569 Unspecified convulsions: Secondary | ICD-10-CM

## 2023-01-25 MED ORDER — ZYRTEC ALLERGY 10 MG PO TABS: 10.0000 mg | ORAL_TABLET | Freq: Every day | ORAL | 1 refills | Status: DC

## 2023-01-25 MED ORDER — METAXALONE 800 MG PO TABS: 800.0000 mg | ORAL_TABLET | Freq: Two times a day (BID) | ORAL | 1 refills | Status: DC

## 2023-01-25 MED ORDER — TRAZODONE HCL 100 MG PO TABS: 100.0000 mg | ORAL_TABLET | Freq: Every day | ORAL | 1 refills | Status: DC

## 2023-06-10 NOTE — Progress Notes (Deleted)
Name: Nicholas Bautista   MRN: [id number]    DOB: 04-17-1986   Date:06/10/2023       Progress Note  Subjective  Chief Complaint  No chief complaint on file.   HPI  *** Patient Active Problem List   Diagnosis Date Noted   Keratoconus of right eye 10/13/2019   Seizure (HCC) 08/14/2018   Muscle spasm 03/18/2015   Wheelchair dependence 03/18/2015   Inguinal hernia, right 03/18/2015   Cerebral palsy (HCC) 03/16/2015   Perennial allergic rhinitis 03/16/2015   Chronic constipation 03/16/2015   Insomnia, transient 03/16/2015   Vitamin D deficiency 03/16/2015    Past Surgical History:  Procedure Laterality Date   ADENOIDECTOMY     DENTAL SURGERY  07/30/2011   TYMPANOSTOMY TUBE PLACEMENT      Family History  Problem Relation Age of Onset   Stroke Mother 56   Obesity Mother    Diabetes Mother    Peptic Ulcer Father    Diabetes Maternal Grandmother    CAD Paternal Grandmother    CAD Maternal Aunt    Diabetes Maternal Uncle    Diabetes Paternal Aunt     Social History   Tobacco Use   Smoking status: Never   Smokeless tobacco: Never  Substance Use Topics   Alcohol use: No    Alcohol/week: 0.0 standard drinks of alcohol     Current Outpatient Medications:    azelastine (OPTIVAR) 0.05 % ophthalmic solution, Place 1 drop into both eyes 2 (two) times daily., Disp: 6 mL, Rfl: 1   bisacodyl (DULCOLAX) 5 MG EC tablet, Take 5 mg by mouth once a week., Disp: , Rfl:    Cholecalciferol 50 MCG (2000 UT) CAPS, Take by mouth., Disp: , Rfl:    diazepam (VALIUM) 5 MG tablet, Take 0.5-1 tablets (2.5-5 mg total) by mouth daily as needed for anxiety., Disp: 30 tablet, Rfl: 0   fluticasone (FLONASE) 50 MCG/ACT nasal spray, Place 2 sprays into both nostrils daily., Disp: 16 g, Rfl: 2   ibuprofen (ADVIL) 600 MG tablet, Take 1 tablet by mouth three times daily as needed, Disp: 90 tablet, Rfl: 0   metaxalone (SKELAXIN) 800 MG tablet, Take 1 tablet (800 mg total) by mouth 2 (two) times  daily., Disp: 180 tablet, Rfl: 1   Multiple Vitamins-Minerals (MULTIVITAMIN ADULT PO), Take by mouth., Disp: , Rfl:    traZODone (DESYREL) 100 MG tablet, Take 1 tablet (100 mg total) by mouth at bedtime., Disp: 90 tablet, Rfl: 1   ZYRTEC ALLERGY 10 MG tablet, Take 1 tablet (10 mg total) by mouth daily., Disp: 90 tablet, Rfl: 1  Allergies  Allergen Reactions   Levofloxacin    Other     Seasonal allergies    I personally reviewed {Reviewed:14835} with the patient/caregiver today.   ROS  ***  Objective  There were no vitals filed for this visit.  There is no height or weight on file to calculate BMI.  Physical Exam ***  No results found for this or any previous visit (from the past 2160 hour(s)).  Diabetic Foot Exam: Diabetic Foot Exam - Simple   No data filed    ***  PHQ2/9:    06/13/2022   11:35 AM 11/29/2021    8:29 AM 07/28/2021   12:56 PM 08/10/2020    1:50 PM 10/13/2019   10:04 AM  Depression screen PHQ 2/9  Decreased Interest 0 0 0 0 0  Down, Depressed, Hopeless 0 0 0 0 0  PHQ - 2  Score 0 0 0 0 0  Altered sleeping 0 0 0  0  Tired, decreased energy 0 0 0  0  Change in appetite 0 0 0  0  Feeling bad or failure about yourself  0 0 0  0  Trouble concentrating 0 0 0  0  Moving slowly or fidgety/restless 0 0 0  0  Suicidal thoughts 0 0 0  0  PHQ-9 Score 0 0 0  0  Difficult doing work/chores   Not difficult at all      phq 9 is {gen pos ZOX:096045} ***  Fall Risk:    01/25/2023   10:16 AM 06/13/2022   11:35 AM 11/29/2021    8:28 AM 07/28/2021   12:55 PM 08/10/2020    1:50 PM  Fall Risk   Falls in the past year? 0 1 0 0 0  Number falls in past yr:  0  0 0  Injury with Fall?  0  0 0  Risk for fall due to : Impaired mobility;Impaired balance/gait Impaired balance/gait;Impaired mobility;Mental status change Impaired mobility;Impaired balance/gait No Fall Risks   Follow up Falls prevention discussed;Education provided;Falls evaluation completed Falls  prevention discussed;Education provided;Falls evaluation completed Falls prevention discussed;Education provided Falls prevention discussed    ***   Functional Status Survey:   ***   Assessment & Plan  *** There are no diagnoses linked to this encounter.

## 2023-06-21 ENCOUNTER — Ambulatory Visit: Payer: MEDICAID | Admitting: Family Medicine

## 2023-07-20 ENCOUNTER — Other Ambulatory Visit: Payer: Self-pay | Admitting: Family Medicine

## 2023-07-20 DIAGNOSIS — G4709 Other insomnia: Secondary | ICD-10-CM

## 2023-08-25 ENCOUNTER — Other Ambulatory Visit: Payer: Self-pay | Admitting: Family Medicine

## 2023-08-25 DIAGNOSIS — M62838 Other muscle spasm: Secondary | ICD-10-CM

## 2023-09-02 ENCOUNTER — Other Ambulatory Visit: Payer: Self-pay | Admitting: Family Medicine

## 2023-09-02 ENCOUNTER — Telehealth: Payer: Self-pay | Admitting: Family Medicine

## 2023-09-02 DIAGNOSIS — M62838 Other muscle spasm: Secondary | ICD-10-CM

## 2023-09-02 DIAGNOSIS — R0981 Nasal congestion: Secondary | ICD-10-CM

## 2023-09-02 DIAGNOSIS — G802 Spastic hemiplegic cerebral palsy: Secondary | ICD-10-CM

## 2023-09-02 DIAGNOSIS — R451 Restlessness and agitation: Secondary | ICD-10-CM

## 2023-09-02 DIAGNOSIS — R569 Unspecified convulsions: Secondary | ICD-10-CM

## 2023-09-02 MED ORDER — DIAZEPAM 5 MG PO TABS: 2.5000 mg | ORAL_TABLET | Freq: Every day | ORAL | 0 refills | Status: AC | PRN

## 2023-09-02 NOTE — Telephone Encounter (Signed)
 Pt has scheduled for 3/31 (your first availability only wanted you). Sister is asking for diazapam only enough to bring him to his appt. Please send to [email or web address] [place]

## 2023-09-02 NOTE — Telephone Encounter (Signed)
 Pt sister stated that's all they needed, besides metaxalone but did PA already and was approved

## 2023-09-02 NOTE — Telephone Encounter (Signed)
 Pt rescheduled his missed appt for 3/31 (your first availability and only wanted to see you). His sister states that he is out of diazapam and would like to know if it could be refilled before his appointment (she has to give it to him just to get him here). Please send to [facility] [place]

## 2023-09-02 NOTE — Telephone Encounter (Signed)
 Copied from CRM [id number]. Topic: Appointments - Medicare AWV >> Sep 02, 2023 11:45 AM Everette C wrote: Reason for CRM: The patient's mother would like for the patient's sister to be contacted to schedule their AWV   Please contact the patient's sister further to successfully schedule the appointment

## 2023-09-30 ENCOUNTER — Telehealth: Payer: Self-pay | Admitting: Family Medicine

## 2023-09-30 ENCOUNTER — Ambulatory Visit (INDEPENDENT_AMBULATORY_CARE_PROVIDER_SITE_OTHER): Payer: MEDICAID | Admitting: Family Medicine

## 2023-09-30 ENCOUNTER — Encounter: Payer: Self-pay | Admitting: Family Medicine

## 2023-09-30 VITALS — BP 118/68 | HR 89 | Resp 16 | Ht 62.0 in | Wt 111.6 lb

## 2023-09-30 DIAGNOSIS — K5909 Other constipation: Secondary | ICD-10-CM

## 2023-09-30 DIAGNOSIS — M62838 Other muscle spasm: Secondary | ICD-10-CM

## 2023-09-30 DIAGNOSIS — H18603 Keratoconus, unspecified, bilateral: Secondary | ICD-10-CM

## 2023-09-30 DIAGNOSIS — G802 Spastic hemiplegic cerebral palsy: Secondary | ICD-10-CM | POA: Diagnosis not present

## 2023-09-30 DIAGNOSIS — J3089 Other allergic rhinitis: Secondary | ICD-10-CM | POA: Diagnosis not present

## 2023-09-30 DIAGNOSIS — R569 Unspecified convulsions: Secondary | ICD-10-CM

## 2023-09-30 DIAGNOSIS — H1013 Acute atopic conjunctivitis, bilateral: Secondary | ICD-10-CM | POA: Insufficient documentation

## 2023-09-30 DIAGNOSIS — G4709 Other insomnia: Secondary | ICD-10-CM

## 2023-09-30 MED ORDER — TRAZODONE HCL 100 MG PO TABS: 100.0000 mg | ORAL_TABLET | Freq: Every day | ORAL | 1 refills | Status: DC

## 2023-09-30 MED ORDER — ZYRTEC ALLERGY 10 MG PO TABS: 10.0000 mg | ORAL_TABLET | Freq: Every day | ORAL | 1 refills | Status: DC

## 2023-09-30 MED ORDER — IBUPROFEN 600 MG PO TABS: 600.0000 mg | ORAL_TABLET | Freq: Three times a day (TID) | ORAL | 0 refills | Status: AC | PRN

## 2023-09-30 MED ORDER — METAXALONE 800 MG PO TABS: 800.0000 mg | ORAL_TABLET | Freq: Two times a day (BID) | ORAL | 1 refills | Status: DC

## 2023-09-30 MED ORDER — AZELASTINE HCL 0.05 % OP SOLN: 1.0000 [drp] | Freq: Two times a day (BID) | OPHTHALMIC | 1 refills | Status: DC

## 2023-09-30 NOTE — Telephone Encounter (Signed)
 PA done waiting on insurance to determine

## 2023-09-30 NOTE — Progress Notes (Signed)
 Name: Nicholas Bautista   MRN: [id number]    DOB: 1985-10-07   Date:09/30/2023       Progress Note  Subjective  Chief Complaint  Chief Complaint  Patient presents with   Medical Management of Chronic Issues   HPI   Cerebral Palsy: he has severe spasm and is wheelchair dependent. He is able to use his gait [employer] exercise psychologist).  He is now on Skelaxin because flexeril stopped working and tizanidine made him very groggy. He has 24 hour care at home, from mother and or  sister.  Mother can understand him, but strangers have difficulty understanding him.  He takes Valium prn for severe spasms or anxiety, no recent episodes of seizures.    AR: he came in with his sister, he does not like nasal spray, discussed little noses, he is still  taking zyrtec daily. Currently not sneezing, no rhinorrhea or nasal congestion. He has intermittent watery / itchy eyes.   Chronic Constipation: he is taking oral laxatives every other day and bowel movements are regular. Stable   Insomnia: doing well on  Trazodone, temazepam had stopped working back in 2020   Seizure: no episodes in many years. He now mostly takes valium  prn prior to procedures, when spasms are severe or prn seizure. He just got refill of Valium   Keratoconus: developed a change on eye back in Feb 22 , he was seen by ophthalmologist at [facility], but was referred by them to [facility] , they discussed surgery ( corneal transplant), mother decided to hold off for now , he goes yearly and has been stable. No surgery indicated at this time   Patient Active Problem List   Diagnosis Date Noted   Keratoconus of right eye 10/13/2019   Seizure (HCC) 08/14/2018   Muscle spasm 03/18/2015   Wheelchair dependence 03/18/2015   Inguinal hernia, right 03/18/2015   Cerebral palsy (HCC) 03/16/2015   Perennial allergic rhinitis 03/16/2015   Chronic constipation 03/16/2015   Insomnia, transient 03/16/2015   Vitamin D deficiency 03/16/2015    Past  Surgical History:  Procedure Laterality Date   ADENOIDECTOMY     DENTAL SURGERY  07/30/2011   TYMPANOSTOMY TUBE PLACEMENT      Family History  Problem Relation Age of Onset   Stroke Mother 62   Obesity Mother    Diabetes Mother    Peptic Ulcer Father    Diabetes Maternal Grandmother    CAD Paternal Grandmother    CAD Maternal Aunt    Diabetes Maternal Uncle    Diabetes Paternal Aunt     Social History   Tobacco Use   Smoking status: Never   Smokeless tobacco: Never  Substance Use Topics   Alcohol use: No    Alcohol/week: 0.0 standard drinks of alcohol     Current Outpatient Medications:    azelastine (OPTIVAR) 0.05 % ophthalmic solution, Place 1 drop into both eyes 2 (two) times daily., Disp: 6 mL, Rfl: 1   bisacodyl (DULCOLAX) 5 MG EC tablet, Take 5 mg by mouth once a week., Disp: , Rfl:    Cholecalciferol 50 MCG (2000 UT) CAPS, Take by mouth., Disp: , Rfl:    diazepam (VALIUM) 5 MG tablet, Take 0.5-1 tablets (2.5-5 mg total) by mouth daily as needed for anxiety., Disp: 10 tablet, Rfl: 0   fluticasone (FLONASE) 50 MCG/ACT nasal spray, Place 2 sprays into both nostrils daily., Disp: 16 g, Rfl: 2   ibuprofen (ADVIL) 600 MG tablet,  Take 1 tablet by mouth three times daily as needed, Disp: 90 tablet, Rfl: 0   metaxalone (SKELAXIN) 800 MG tablet, Take 1 tablet (800 mg total) by mouth 2 (two) times daily., Disp: 180 tablet, Rfl: 1   Multiple Vitamins-Minerals (MULTIVITAMIN ADULT PO), Take by mouth., Disp: , Rfl:    traZODone (DESYREL) 100 MG tablet, TAKE 1 TABLET BY MOUTH AT BEDTIME, Disp: 90 tablet, Rfl: 0   ZYRTEC ALLERGY 10 MG tablet, Take 1 tablet (10 mg total) by mouth daily., Disp: 90 tablet, Rfl: 1  Allergies  Allergen Reactions   Levofloxacin    Other     Seasonal allergies    I personally reviewed active problem list, medication list, allergies with the patient/caregiver today.   ROS  Ten systems reviewed and is negative except as mentioned in HPI     Objective Physical Exam Constitutional: Patient appears well-developed and well-nourished.  No distress.  HEENT: head atraumatic, normocephalic, pupils equal and reactive to light, neck supple Cardiovascular: Normal rate, regular rhythm and normal heart sounds.  No murmur heard. No BLE edema. Pulmonary/Chest: Effort normal and breath sounds normal. No respiratory distress. Abdominal: Soft.  There is no tenderness. Muscular skeletal: involuntary head and hand motion, atrophy legs, sitting on wheelchair  Psychiatric: Patient has a normal mood and affect. behavior is normal. Judgment and thought content normal.   Vitals:   09/30/23 1022  BP: 118/68  Pulse: 89  Resp: 16  SpO2: 98%  Weight: 111 lb 9.6 oz (50.6 kg)  Height: 5\' 2"  (1.575 m)    Body mass index is 20.41 kg/m.  No results found for this or any previous visit (from the past 2160 hours).  Diabetic Foot Exam:     PHQ2/9:    09/30/2023   10:23 AM 06/13/2022   11:35 AM 11/29/2021    8:29 AM 07/28/2021   12:56 PM 08/10/2020    1:50 PM  Depression screen PHQ 2/9  Decreased Interest 0 0 0 0 0  Down, Depressed, Hopeless 0 0 0 0 0  PHQ - 2 Score 0 0 0 0 0  Altered sleeping 0 0 0 0   Tired, decreased energy 0 0 0 0   Change in appetite 0 0 0 0   Feeling bad or failure about yourself  0 0 0 0   Trouble concentrating 0 0 0 0   Moving slowly or fidgety/restless 0 0 0 0   Suicidal thoughts 0 0 0 0   PHQ-9 Score 0 0 0 0   Difficult doing work/chores Not difficult at all   Not difficult at all     phq 9 is negative  Fall Risk:    01/25/2023   10:16 AM 06/13/2022   11:35 AM 11/29/2021    8:28 AM 07/28/2021   12:55 PM 08/10/2020    1:50 PM  Fall Risk   Falls in the past year? 0 1 0 0 0  Number falls in past yr:  0  0 0  Injury with Fall?  0  0 0  Risk for fall due to : Impaired mobility;Impaired balance/gait Impaired balance/gait;Impaired mobility;Mental status change Impaired mobility;Impaired balance/gait No Fall  Risks   Follow up Falls prevention discussed;Education provided;Falls evaluation completed Falls prevention discussed;Education provided;Falls evaluation completed Falls prevention discussed;Education provided Falls prevention discussed     Assessment & Plan  1. Spastic hemiplegic cerebral palsy (HCC) (Primary)  - metaxalone (SKELAXIN) 800 MG tablet; Take 1 tablet (800 mg total) by mouth 2 (  two) times daily.  Dispense: 180 tablet; Refill: 1  2. Seizure [facility])  He has valium at home  3. Keratoconus of both eyes  Up to date with eye visits  4. Perennial allergic rhinitis  - ZYRTEC ALLERGY 10 MG tablet; Take 1 tablet (10 mg total) by mouth daily.  Dispense: 90 tablet; Refill: 1  5. Other insomnia  - traZODone (DESYREL) 100 MG tablet; Take 1 tablet (100 mg total) by mouth at bedtime.  Dispense: 90 tablet; Refill: 1  6. Chronic constipation  stable  7. Allergic conjunctivitis of both eyes  - azelastine (OPTIVAR) 0.05 % ophthalmic solution; Place 1 drop into both eyes 2 (two) times daily.  Dispense: 6 mL; Refill: 1  8. Muscle spasm  - ibuprofen (ADVIL) 600 MG tablet; Take 1 tablet (600 mg total) by mouth 3 (three) times daily as needed.  Dispense: 90 tablet; Refill: 0 - metaxalone (SKELAXIN) 800 MG tablet; Take 1 tablet (800 mg total) by mouth 2 (two) times daily.  Dispense: 180 tablet; Refill: 1

## 2023-09-30 NOTE — Telephone Encounter (Signed)
 Cover my meds  azelastine (OPTIVAR) 0.05 % ophthalmic solution   Key: [id number]

## 2023-11-17 ENCOUNTER — Other Ambulatory Visit: Payer: Self-pay | Admitting: Family Medicine

## 2023-11-17 DIAGNOSIS — H1013 Acute atopic conjunctivitis, bilateral: Secondary | ICD-10-CM

## 2024-03-23 ENCOUNTER — Other Ambulatory Visit: Payer: Self-pay | Admitting: Family Medicine

## 2024-03-23 DIAGNOSIS — J3089 Other allergic rhinitis: Secondary | ICD-10-CM

## 2024-03-31 ENCOUNTER — Ambulatory Visit: Payer: MEDICAID | Admitting: Family Medicine

## 2024-04-22 ENCOUNTER — Other Ambulatory Visit: Payer: Self-pay | Admitting: Family Medicine

## 2024-04-22 DIAGNOSIS — J3089 Other allergic rhinitis: Secondary | ICD-10-CM

## 2024-05-25 ENCOUNTER — Ambulatory Visit: Payer: MEDICAID | Admitting: Family Medicine

## 2024-05-25 ENCOUNTER — Encounter: Payer: Self-pay | Admitting: Family Medicine

## 2024-05-25 VITALS — BP 130/78 | HR 78 | Resp 16 | Ht 62.0 in | Wt 116.1 lb

## 2024-05-25 DIAGNOSIS — H1013 Acute atopic conjunctivitis, bilateral: Secondary | ICD-10-CM | POA: Diagnosis not present

## 2024-05-25 DIAGNOSIS — K5909 Other constipation: Secondary | ICD-10-CM

## 2024-05-25 DIAGNOSIS — R569 Unspecified convulsions: Secondary | ICD-10-CM

## 2024-05-25 DIAGNOSIS — G4709 Other insomnia: Secondary | ICD-10-CM

## 2024-05-25 DIAGNOSIS — Z1159 Encounter for screening for other viral diseases: Secondary | ICD-10-CM

## 2024-05-25 DIAGNOSIS — Z131 Encounter for screening for diabetes mellitus: Secondary | ICD-10-CM

## 2024-05-25 DIAGNOSIS — M62838 Other muscle spasm: Secondary | ICD-10-CM

## 2024-05-25 DIAGNOSIS — J3089 Other allergic rhinitis: Secondary | ICD-10-CM

## 2024-05-25 DIAGNOSIS — H18603 Keratoconus, unspecified, bilateral: Secondary | ICD-10-CM

## 2024-05-25 DIAGNOSIS — G802 Spastic hemiplegic cerebral palsy: Secondary | ICD-10-CM | POA: Diagnosis not present

## 2024-05-25 DIAGNOSIS — Z79899 Other long term (current) drug therapy: Secondary | ICD-10-CM

## 2024-05-25 DIAGNOSIS — Z23 Encounter for immunization: Secondary | ICD-10-CM

## 2024-05-25 MED ORDER — METAXALONE 800 MG PO TABS: 800.0000 mg | ORAL_TABLET | Freq: Two times a day (BID) | ORAL | 1 refills | Status: AC

## 2024-05-25 MED ORDER — QUETIAPINE FUMARATE 25 MG PO TABS: 25.0000 mg | ORAL_TABLET | Freq: Every day | ORAL | 1 refills | Status: AC

## 2024-05-25 MED ORDER — CETIRIZINE HCL 10 MG PO TABS: 10.0000 mg | ORAL_TABLET | Freq: Every day | ORAL | 0 refills | Status: AC

## 2024-05-25 NOTE — Progress Notes (Signed)
 Name: Nicholas Bautista   MRN: [id number]    DOB: [birth year]   Date:05/25/2024       Progress Note  Subjective  Chief Complaint  Chief Complaint  Patient presents with   Medical Management of Chronic Issues   Discussed the use of AI scribe software for clinical note transcription with the patient, who gave verbal consent to proceed.  History of Present Illness Nicholas Bautista is a 38 year old male with cerebral palsy who presents for a follow-up visit. He is accompanied by his sister.  He has cerebral palsy with severe spasms, managed with metaxalone  as a muscle relaxant. He takes metaxalone  every night and additionally in the morning if planning to be out. Ibuprofen  is used for pain management as needed.  He has a history of seizures, but according to his family, he has not had any recent seizures. Diazepam  suppositories are available at home for seizure management, and Valium  is available for cerebral palsy-related spasms or anxiety.  He experiences chronic constipation but has been having regular bowel movements without the need for laxatives, likely due to increased water intake.  He suffers from insomnia and takes trazodone , which helps him fall asleep, but he wakes up around 4:30 to 5:00 AM. He eventually falls back asleep, but this early waking is noted by his mother, who hears him laughing in his room. He does not leave his room unless necessary.  He has keratoconus in both eyes and sees a specialist yearly. He also has allergic conjunctivitis and rhinitis, for which he takes Zyrtec  nightly and uses eye drops seasonally as needed.  His family includes his mother, who is his primary caregiver, and siblings who are involved in his care.    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   Seizure (HCC) 08/14/2018   Muscle spasm 03/18/2015   Wheelchair dependence 03/18/2015   Inguinal  hernia, right 03/18/2015   Cerebral palsy (HCC) 03/16/2015   Perennial allergic rhinitis 03/16/2015   Chronic constipation 03/16/2015   Other insomnia 03/16/2015   Vitamin D deficiency 03/16/2015    Past Surgical History:  Procedure Laterality Date   ADENOIDECTOMY     DENTAL SURGERY  07/30/2011   TYMPANOSTOMY TUBE PLACEMENT      Family History  Problem Relation Age of Onset   Stroke Mother 30   Obesity Mother    Diabetes Mother    Peptic Ulcer Father    Diabetes Maternal Grandmother    CAD Paternal Grandmother    CAD Maternal Aunt    Diabetes Maternal Uncle    Diabetes Paternal Aunt     Social History   Tobacco Use   Smoking status: Never   Smokeless tobacco: Never  Substance Use Topics   Alcohol use: No    Alcohol/week: 0.0 standard drinks of alcohol     Current Outpatient Medications:    azelastine  (OPTIVAR ) 0.05 % ophthalmic solution, INSTILL 1 DROP INTO EACH EYE TWICE DAILY, Disp: 6 mL, Rfl: 0   bisacodyl (DULCOLAX) 5 MG EC tablet, Take 5 mg by mouth once a week., Disp: , Rfl:    cetirizine  (ZYRTEC ) 10 MG tablet, Take 1 tablet by mouth once daily, Disp: 90 tablet, Rfl: 0   Cholecalciferol 50 MCG (2000 UT) CAPS, Take by mouth., Disp: , Rfl:    diazepam  (VALIUM ) 5 MG tablet, Take 0.5-1 tablets (2.5-5 mg total) by mouth daily  as needed for anxiety., Disp: 10 tablet, Rfl: 0   ibuprofen  (ADVIL ) 600 MG tablet, Take 1 tablet (600 mg total) by mouth 3 (three) times daily as needed., Disp: 90 tablet, Rfl: 0   metaxalone  (SKELAXIN ) 800 MG tablet, Take 1 tablet (800 mg total) by mouth 2 (two) times daily., Disp: 180 tablet, Rfl: 1   Multiple Vitamins-Minerals (MULTIVITAMIN ADULT PO), Take by mouth., Disp: , Rfl:    traZODone  (DESYREL ) 100 MG tablet, Take 1 tablet (100 mg total) by mouth at bedtime., Disp: 90 tablet, Rfl: 1  Allergies  Allergen Reactions   Levofloxacin    Other     Seasonal allergies    I personally reviewed active problem list, medication list ,  family history, social history with the patient/caregiver today.   ROS  Ten systems reviewed and is negative except as mentioned in HPI    Objective Physical Exam CONSTITUTIONAL: Patient appears well-developed and well-nourished. No distress. HEENT: Head atraumatic, normocephalic, neck supple. CARDIOVASCULAR: Normal rate, regular rhythm and normal heart sounds. No murmur heard. No BLE edema. PULMONARY: Effort normal and breath sounds normal. No respiratory distress. ABDOMINAL: There is no tenderness or distention. MUSCULOSKELETAL: Normal gait. Without gross motor or sensory deficit. PSYCHIATRIC: Patient has a normal mood and affect. Behavior is normal. Judgment and thought content normal.  Vitals:   05/25/24 1406  BP: 130/78  Pulse: 78  Resp: 16  SpO2: 98%  Weight: 116 lb 1.6 oz (52.7 kg)  Height: 5' 2 (1.575 m)    Body mass index is 21.23 kg/m.   PHQ2/9:    05/25/2024    1:59 PM 09/30/2023   10:23 AM 06/13/2022   11:35 AM 11/29/2021    8:29 AM 07/28/2021   12:56 PM  Depression screen PHQ 2/9  Decreased Interest 0 0 0 0 0  Down, Depressed, Hopeless 0 0 0 0 0  PHQ - 2 Score 0 0 0 0 0  Altered sleeping  0 0 0 0  Tired, decreased energy  0 0 0 0  Change in appetite  0 0 0 0  Feeling bad or failure about yourself   0 0 0 0  Trouble concentrating  0 0 0 0  Moving slowly or fidgety/restless  0 0 0 0  Suicidal thoughts  0 0 0 0  PHQ-9 Score  0  0  0  0   Difficult doing work/chores  Not difficult at all   Not difficult at all     Data saved with a previous flowsheet row definition    phq 9 is negative  Fall Risk:    05/25/2024    1:59 PM 01/25/2023   10:16 AM 06/13/2022   11:35 AM 11/29/2021    8:28 AM 07/28/2021   12:55 PM  Fall Risk   Falls in the past year? 0 0 1 0 0  Number falls in past yr: 0  0  0  Injury with Fall? 0  0  0  Risk for fall due to : No Fall Risks Impaired mobility;Impaired balance/gait Impaired balance/gait;Impaired mobility;Mental status  change Impaired mobility;Impaired balance/gait No Fall Risks  Follow up Falls evaluation completed Falls prevention discussed;Education provided;Falls evaluation completed Falls prevention discussed;Education provided;Falls evaluation completed  Falls prevention discussed;Education provided  Falls prevention discussed      Data saved with a previous flowsheet row definition      Assessment & Plan Spastic hemiplegic cerebral palsy with severe muscle spasms Severe muscle spasms managed with metaxalone , effective in  controlling spasms. Diazepam  available but not recently needed. - Continue metaxalone  as prescribed, up to twice daily. - Ensure availability of diazepam  for spasms or anxiety.  Seizures - no recent episodes   Chronic constipation Regular bowel movements without laxatives. - Continue current dietary modifications, including increased water intake.  Insomnia Managed with trazodone , but considering switching to quetiapine  to improve sleep duration and quality. - Switched from trazodone  to quetiapine  for insomnia management. - Instructed to administer quetiapine  earlier in the evening to adjust sleep schedule.  Keratoconus, bilateral Bilateral keratoconus well-managed with no progression. Regular follow-up with a specialist maintained. - Continue regular follow-up with ophthalmology for keratoconus management.  AR Managed with Zyrtec  and eye drops as needed, primarily during allergy  seasons. - Continue Zyrtec  for allergic rhinitis. - Use eye drops as needed for conjunctivitis.  General Health Maintenance Flu shot received.

## 2024-05-26 ENCOUNTER — Other Ambulatory Visit (HOSPITAL_COMMUNITY): Payer: Self-pay

## 2024-05-26 ENCOUNTER — Telehealth: Payer: Self-pay | Admitting: Pharmacy Technician

## 2024-05-26 NOTE — Telephone Encounter (Signed)
 Pharmacy Patient Advocate Encounter   Received notification from CoverMyMeds that prior authorization for QUEtiapine  Fumarate 25MG  tablets is required/requested.   Insurance verification completed.   The patient is insured through [employer].   Per test claim: PA required; PA started via CoverMyMeds. KEY [id number] . Waiting for clinical questions to populate.

## 2024-05-27 NOTE — Telephone Encounter (Signed)
 Pharmacy Patient Advocate Encounter  Received notification from [facility] that Prior Authorization for QUEtiapine  Fumarate 25MG  tablets has been DENIED.  Full denial letter will be uploaded to the media tab. See denial reason below.     PA #/Case ID/Reference #: [id number]

## 2024-06-02 ENCOUNTER — Other Ambulatory Visit (HOSPITAL_COMMUNITY): Payer: Self-pay

## 2024-06-04 ENCOUNTER — Telehealth: Payer: Self-pay

## 2024-06-04 NOTE — Telephone Encounter (Signed)
 Copied from CRM #[id number]. Topic: Clinical - Prescription Issue >> Jun 04, 2024  3:01 PM Winona SAUNDERS wrote: [facility] pharmacy [phone]  requesting a safety form for the pt medication QUEtiapine  (SEROQUEL ) 25 MG tablet [[id number]] >> Jun 04, 2024  4:24 PM CMA Warren MATSU wrote: Tried to call pharmacy x2 kept ringing, never done a safefty medication form? Where can

## 2024-06-04 NOTE — Telephone Encounter (Signed)
 Please start PA for quantity excemption

## 2024-06-05 ENCOUNTER — Other Ambulatory Visit (HOSPITAL_COMMUNITY): Payer: Self-pay

## 2024-06-05 NOTE — Telephone Encounter (Signed)
 Pt mom will give it a try and contact us  if any issues.

## 2024-06-05 NOTE — Telephone Encounter (Signed)
 Good morning Warren, I just got off the phone with his insurance and the PA was denied initially because of the dx so I am unable to get a qty exception or override.

## 2024-06-22 ENCOUNTER — Ambulatory Visit: Payer: Self-pay

## 2024-06-22 ENCOUNTER — Other Ambulatory Visit: Payer: Self-pay

## 2024-06-22 ENCOUNTER — Emergency Department
Admission: EM | Admit: 2024-06-22 | Discharge: 2024-06-22 | Disposition: A | Discharge status: Home / Self Care | Payer: MEDICAID | Attending: Emergency Medicine | Admitting: Emergency Medicine

## 2024-06-22 ENCOUNTER — Encounter: Payer: Self-pay | Admitting: Emergency Medicine

## 2024-06-22 DIAGNOSIS — H16002 Unspecified corneal ulcer, left eye: Secondary | ICD-10-CM | POA: Diagnosis not present

## 2024-06-22 DIAGNOSIS — H17812 Minor opacity of cornea, left eye: Secondary | ICD-10-CM | POA: Diagnosis not present

## 2024-06-22 DIAGNOSIS — H5712 Ocular pain, left eye: Secondary | ICD-10-CM | POA: Diagnosis present

## 2024-06-22 MED ORDER — FLUORESCEIN SODIUM 1 MG OP STRP
1.0000 | ORAL_STRIP | Freq: Once | OPHTHALMIC | Status: AC
  Administered 2024-06-22: 1 via OPHTHALMIC
  Filled 2024-06-22: qty 1

## 2024-06-22 MED ORDER — TETRACAINE HCL 0.5 % OP SOLN
1.0000 [drp] | Freq: Once | OPHTHALMIC | Status: AC
  Administered 2024-06-22: 1 [drp] via OPHTHALMIC
  Filled 2024-06-22: qty 4

## 2024-06-22 NOTE — ED Provider Notes (Signed)
 "  [facility] Provider Note    Event Date/Time   First MD Initiated Contact with Patient 06/22/24 1127     (approximate)   History   Eye Injury   HPI  Nicholas Bautista is a 38 y.o. male  with a past medical history of cerebral palsy, vitamin D deficiency, wheelchair dependence, seizure, right inguinal hernia, chronic constipation presents to the emergency department with left eye pain and tearing.  His sister is with him and providing history at this time.  His sister is unsure if he scratched his eye, but he has been rubbing it for 3 days.  She believes he poked himself in the eye.  She reports he has some intermittent clouding of the left eye.  He has a history of keratoconus of his right eye listed in his chart, but the sister says it is in his left eye only.  Unable to assess whether this patient has acute vision changes given his history of cerebral palsy.    Physical Exam   Triage Vital Signs: ED Triage Vitals  Encounter Vitals Group     BP 06/22/24 1121 (!) 146/82     Girls Systolic BP Percentile --      Girls Diastolic BP Percentile --      Boys Systolic BP Percentile --      Boys Diastolic BP Percentile --      Pulse Rate 06/22/24 1118 70     Resp 06/22/24 1118 18     Temp 06/22/24 1118 98 F (36.7 C)     Temp Source 06/22/24 1118 Axillary     SpO2 06/22/24 1118 98 %     Weight 06/22/24 1116 116 lb (52.6 kg)     Height 06/22/24 1116 5' 2 (1.575 m)     Head Circumference --      Peak Flow --      Pain Score --      Pain Loc --      Pain Education --      Exclude from Growth Chart --     Most recent vital signs: Vitals:   06/22/24 1118 06/22/24 1121  BP:  (!) 146/82  Pulse: 70   Resp: 18   Temp: 98 F (36.7 C)   SpO2: 98%     General: Awake, in no acute distress. Head: Normocephalic, atraumatic. Eyes: Limited exam. Left cornea appears hazy and is cone-shaped. Hard to appreciate whether left pupil is constricting/dilating given  degree of haziness. Right eye constricts and dilates appropriately. Ears/Nose/Throat: Nares patent, no nasal discharge.  Neck: Supple. CV: Good peripheral perfusion.  Respiratory:Normal respiratory effort.  No respiratory distress.  GI: Soft, non-distended. Skin:Warm, dry, intact.   ED Results / Procedures / Treatments   Labs (all labs ordered are listed, but only abnormal results are displayed) Labs Reviewed - No data to display   EKG     RADIOLOGY    PROCEDURES:  Critical Care performed: No   Procedures   MEDICATIONS ORDERED IN ED: Medications  tetracaine  (PONTOCAINE) 0.5 % ophthalmic solution 1 drop (1 drop Left Eye Given by Other 06/22/24 1204)  fluorescein  ophthalmic strip 1 strip (1 strip Left Eye Given by Other 06/22/24 1204)     IMPRESSION / MDM / ASSESSMENT AND PLAN / ED COURSE  I reviewed the triage vital signs and the nursing notes.  Differential diagnosis includes, but is not limited to, corneal ulceration, corneal abrasion, keratoconus complication, conjunctivitis, glaucoma  Patient's presentation is most consistent with acute complicated illness / injury requiring diagnostic workup.  Patient is a 38 year old male presenting with left eye pain and tearing that started 2 days ago with his sister who is in charge of his care. He is afebrile, no discharge from the eye or periorbital swelling. He has some haziness to the left cornea and has a cone-shaped cornea consistent with his care to the conus.  Unable to assess visual acuity, eye pressure, and whether he is having acute vision changes at this time due to his hx of cerebral palsy and extreme movement with any palpation near his eye.  Woods lamp exam shows circular lesion at the 6 to 8 o'clock position on the left eye, concerning for corneal ulceration.  I did call the ophthalmologist on-call Dr. Feliciano Ober and discussed this patient with him.  He reported to me to hold off on  antibiotics at this time and he will see the patient in office later this afternoon to determine intervention and if any medications are warranted.  Patient and his sister are in agreement to the plan and will follow up with ophthalmology this afternoon.  Patient's guardian was given the opportunity to ask questions; all questions were answered. The patient may return to the emergency department for any new, worsening, or concerning symptoms. Emergency department return precautions were discussed with the patient's guardian.  Patient's guardian is in agreement to the treatment plan.  Patient is stable for discharge.      FINAL CLINICAL IMPRESSION(S) / ED DIAGNOSES   Final diagnoses:  Corneal ulceration, left  Minor opacity of cornea of left eye     Rx / DC Orders   ED Discharge Orders     None        Note:  This document was prepared using Dragon voice recognition software and may include unintentional dictation errors.     Sheron Salm, PA-C 06/22/24 1356    Arlander Charleston, MD 06/22/24 1441  "

## 2024-06-22 NOTE — Telephone Encounter (Signed)
 FYI Only or Action Required?: FYI only for provider: ED advised.  Patient was last seen in primary care on 05/25/2024 by Glenard Mire, MD.  Called Nurse Triage reporting Eye Injury.  Symptoms began 3 days ago.  Interventions attempted: Rest, hydration, or home remedies.  Symptoms are: gradually worsening.  Triage Disposition: Go to ED Now (Notify PCP)  Patient/caregiver understands and will follow disposition?: yes    Reason for Disposition  Bloody or cloudy fluid behind the cornea (clear part)  Answer Assessment - Initial Assessment Questions 1. MECHANISM: How did the injury happen?      Poked himself in left eye 2. ONSET: When did the injury happen? (e.g., minutes, hours ago)      friday 3. LOCATION: What part of the eye is injured? (cornea, sclera, eyelid, or periorbital tissue)     unknown 4. APPEARANCE: What does the eye look like?      Looks cloudy, watering, will not open eye normally 5. VISION: Do you have blurred vision?     unknown 6. PAIN: Is it painful? If Yes, ask: How bad is the pain? (Scale 0-10; or none, mild, moderate, severe)     mild 7. SIZE: For cuts, bruises, or swelling, ask: How large is it? (e.g., inches or centimeters)      no  9. CONTACT LENS: Do you wear contacts?      10. OTHER SYMPTOMS: Do you have any other symptoms? (e.g., headache, neck pain, vomiting)       no  Protocols used: Eye Injury-A-AH

## 2024-06-22 NOTE — Discharge Instructions (Addendum)
 You were seen in the emergency department for a suspected injury to your left eye versus a complication of your keratoconus. Follow-up with the ophthalmologist (Dr. Enola) listed today at the address listed below. He knows you are coming today. He will determine if you need antibiotics and do a further evaluation of your eye. Return to the ER if he experiences any vision changes, worsening eye pain, worsening haziness, discharge, swelling, or any other new, worsening, or concerning symptoms.  [facility] [address]  [place], [place] [address]

## 2024-06-22 NOTE — ED Triage Notes (Signed)
 Pt via POV from home. Pt is accompanied with sister. Pt c/o L eye pain, states he poked himself in the eye. Since then pt c/o discomfort and redness. Pt has a hx of cerebral palsy. Pt is at his baseline per sister.

## 2024-06-22 NOTE — Telephone Encounter (Signed)
 Copied from CRM #[id number]. Topic: Clinical - Red Word Triage >> Jun 22, 2024  8:45 AM Rosina BIRCH wrote: Reason for RMF:[id number] sister called stating the patient poked hisself in the eye on Saturday and he has discomfort and the eye is cloudy

## 2024-08-07 ENCOUNTER — Ambulatory Visit: Payer: MEDICAID | Admitting: Family Medicine

## 2024-08-07 ENCOUNTER — Encounter: Payer: Self-pay | Admitting: Family Medicine

## 2024-08-07 VITALS — BP 114/72 | HR 74 | Resp 16 | Ht 62.0 in | Wt 116.1 lb

## 2024-08-07 DIAGNOSIS — R569 Unspecified convulsions: Secondary | ICD-10-CM

## 2024-08-07 DIAGNOSIS — H18603 Keratoconus, unspecified, bilateral: Secondary | ICD-10-CM

## 2024-08-07 DIAGNOSIS — G802 Spastic hemiplegic cerebral palsy: Secondary | ICD-10-CM

## 2024-08-07 DIAGNOSIS — L738 Other specified follicular disorders: Secondary | ICD-10-CM | POA: Insufficient documentation

## 2024-08-07 NOTE — Progress Notes (Signed)
 Name: Nicholas Bautista   MRN: [id number]    DOB: 06-Sep-1985   Date:08/07/2024       Progress Note  Subjective  Chief Complaint  Chief Complaint  Patient presents with   Mass    R lower cheek, at least 2 bumps denies pain    Discussed the use of AI scribe software for clinical note transcription with the patient, who gave verbal consent to proceed.  History of Present Illness Nicholas Bautista is a 39 year old male who presents with bumps on his face.  He has had hard, persistent bumps on his face for over a year, which he believes may be related to ingrown hairs from his beard. He attempted to pop them without success. The bumps are not painful, and he is primarily concerned about ensuring they are not serious.  He has a history of keratoconus, which worsened when his cornea 'popped' and became cloudy around June 22, 2024. He was taken to the ER, diagnosed with a corneal ulceration, and referred to [facility] for further treatment. He received eye drops but faced challenges with follow-up care due to insurance issues. He eventually secured an appointment with an eye doctor in [place] on July 29, 2024. His vision has improved by 90% since the incident, but he experiences light sensitivity and primarily uses his right eye for vision.  No seizures have been reported, and he does not require medication refills at this time. His blood pressure was high during the ER visit. He uses a camera to clean his ears and recently had them cleaned due to itching and wax buildup.    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   Seizure (HCC) 08/14/2018   Muscle spasm 03/18/2015   Wheelchair dependence 03/18/2015   Inguinal hernia, right 03/18/2015   Cerebral palsy (HCC) 03/16/2015   Perennial allergic rhinitis 03/16/2015   Chronic constipation 03/16/2015   Other insomnia 03/16/2015   Vitamin  D deficiency 03/16/2015    Social History   Tobacco Use   Smoking status: Never   Smokeless tobacco: Never  Substance Use Topics   Alcohol use: No    Alcohol/week: 0.0 standard drinks of alcohol    Current Medications[1]  Allergies[2]  ROS  Ten systems reviewed and is negative except as mentioned in HPI    Objective  Vitals:   08/07/24 1420  BP: 114/72  Pulse: 74  Resp: 16  SpO2: 99%  Weight: 116 lb 1.6 oz (52.7 kg)  Height: 5' 2 (1.575 m)    Body mass index is 21.23 kg/m.   Physical Exam CONSTITUTIONAL: Patient appears well-developed and well-nourished. No distress. HEENT: Head atraumatic, normocephalic, neck supple. Ears normal after cerumen removal. CARDIOVASCULAR: Normal rate, regular rhythm and normal heart sounds. No murmur heard. No BLE edema. PULMONARY: Effort normal and breath sounds normal. No respiratory distress. ABDOMINAL: There is no tenderness or distention. MUSCULOSKELETAL: Normal gait. Without gross motor or sensory deficit. PSYCHIATRIC: Patient has a normal mood and affect. Behavior is normal. Judgment and thought content normal. SKIN: Subcutaneous nodules on face, non-tender, chronic.   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 Mild condition without infection. - Advised keeping area clean. - Consider ketoconazole for facial hygiene. - Encouraged facial hair growth to reduce ingrown hairs.  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 Referred  for corneal transplant evaluation. Light sensitivity noted. - Obtain records from eye specialist in [place]. - Refer to cornea specialist for transplant evaluation.  Cerebral Palsy and seizures - doing well on medications        [1]  Current Outpatient Medications:    azelastine  (OPTIVAR ) 0.05 % ophthalmic solution, INSTILL 1 DROP INTO EACH EYE TWICE DAILY, Disp: 6 mL, Rfl: 0    bisacodyl (DULCOLAX) 5 MG EC tablet, Take 5 mg by mouth once a week., Disp: , Rfl:    cetirizine  (ZYRTEC ) 10 MG tablet, Take 1 tablet (10 mg total) by mouth daily., Disp: 90 tablet, Rfl: 0   Cholecalciferol 50 MCG (2000 UT) CAPS, Take by mouth., Disp: , Rfl:    diazepam  (VALIUM ) 5 MG tablet, Take 0.5-1 tablets (2.5-5 mg total) by mouth daily as needed for anxiety., Disp: 10 tablet, Rfl: 0   ibuprofen  (ADVIL ) 600 MG tablet, Take 1 tablet (600 mg total) by mouth 3 (three) times daily as needed., Disp: 90 tablet, Rfl: 0   metaxalone  (SKELAXIN ) 800 MG tablet, Take 1 tablet (800 mg total) by mouth 2 (two) times daily., Disp: 180 tablet, Rfl: 1   Multiple Vitamins-Minerals (MULTIVITAMIN ADULT PO), Take by mouth., Disp: , Rfl:    neomycin -polymyxin b-dexamethasone (MAXITROL) 3.5-10000-0.1 SUSP, 1 drop 3 (three) times daily., Disp: , Rfl:    QUEtiapine  (SEROQUEL ) 25 MG tablet, Take 1 tablet (25 mg total) by mouth at bedtime., Disp: 90 tablet, Rfl: 1 [2]  Allergies Allergen Reactions   Levofloxacin    Other     Seasonal allergies

## 2024-11-24 ENCOUNTER — Ambulatory Visit: Payer: MEDICAID | Admitting: Family Medicine
